# Patient Record
Sex: Female | Born: 1980 | Race: White | Hispanic: No | Marital: Married | Smoking: Never smoker
Health system: Southern US, Community
[De-identification: ages and names within clinical notes are randomized; demographics above are authoritative.]

## PROBLEM LIST (undated history)

## (undated) DIAGNOSIS — L309 Dermatitis, unspecified: Secondary | ICD-10-CM

## (undated) HISTORY — DX: Dermatitis, unspecified: L30.9

## (undated) HISTORY — PX: APPENDECTOMY (OPEN): SHX54

---

## 2016-02-14 DIAGNOSIS — O3680X0 Pregnancy with inconclusive fetal viability, not applicable or unspecified: Secondary | ICD-10-CM | POA: Insufficient documentation

## 2016-06-05 DIAGNOSIS — O034 Incomplete spontaneous abortion without complication: Secondary | ICD-10-CM | POA: Insufficient documentation

## 2016-06-05 DIAGNOSIS — O2620 Pregnancy care for patient with recurrent pregnancy loss, unspecified trimester: Secondary | ICD-10-CM | POA: Insufficient documentation

## 2019-08-14 HISTORY — PX: FOOT SURGERY: SHX648

## 2020-03-02 IMAGING — MG MA Digital Screening Mammogram
5 of 9 series · 5 of 25 positions shown · non-contrast
Comparison: none

INDICATIONS FOR EXAMINATION:                                                              
 Patient History:                                                                          
 Menarche at age 11. First Full-Term Pregnancy at age 37. Late child-bearing (after 28).   
 Patient has                                                                               
 history of breast feeding.                                                                
 Maternal grandmother had breast cancer under age 50.                                      
 Last menstrual period: 01/30/2020                                                         
 Last screening mammogram was performed 12 month(s) ago.
REASON FOR EXAM: Screening  (asymptomatic).

[L CC]
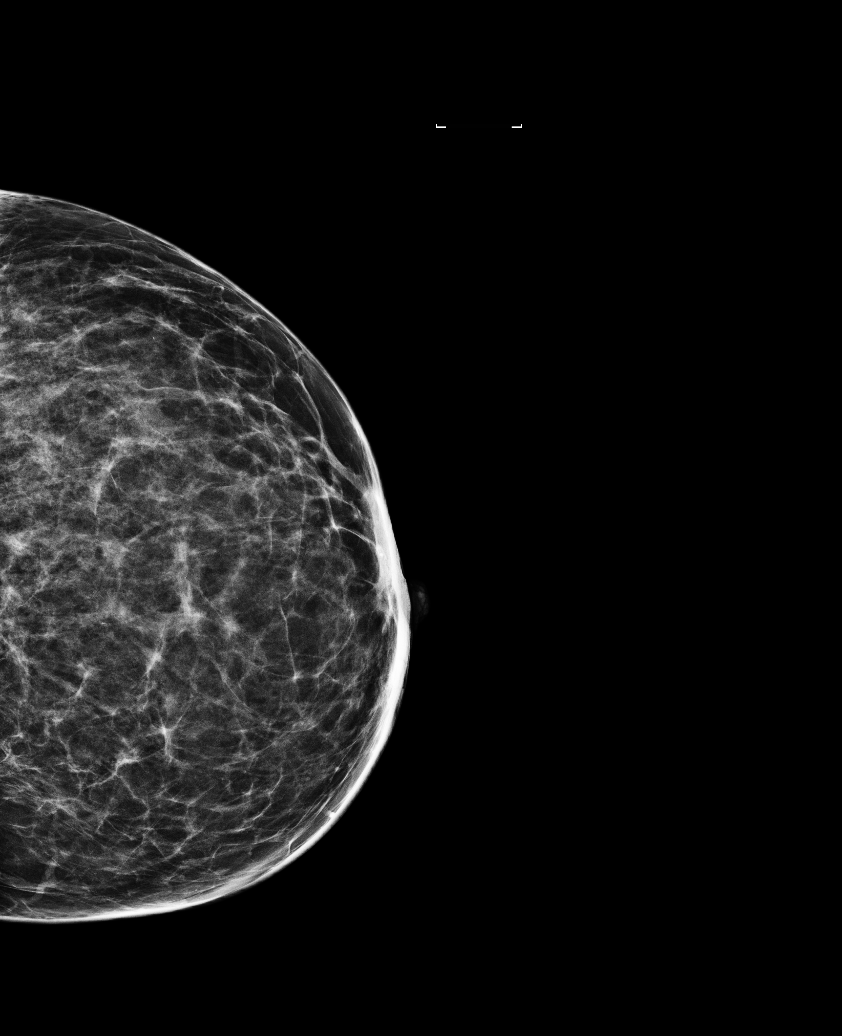

[R CC synth-2D]
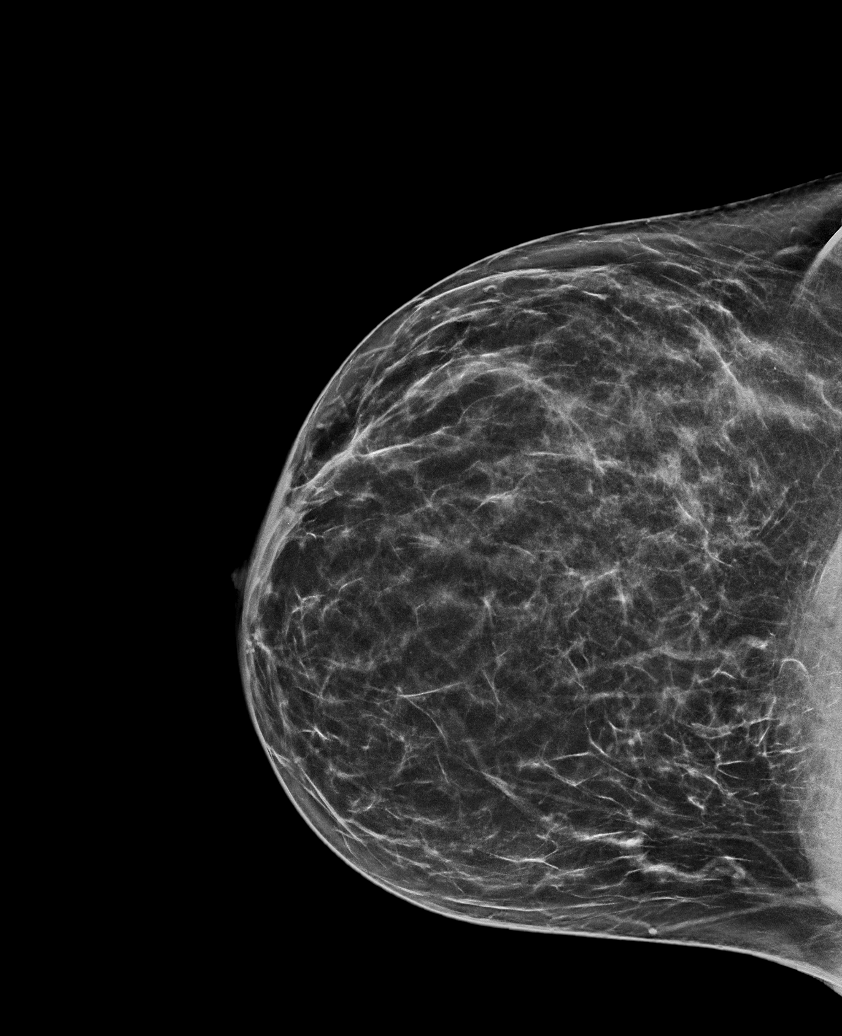

[R MLO synth-2D]
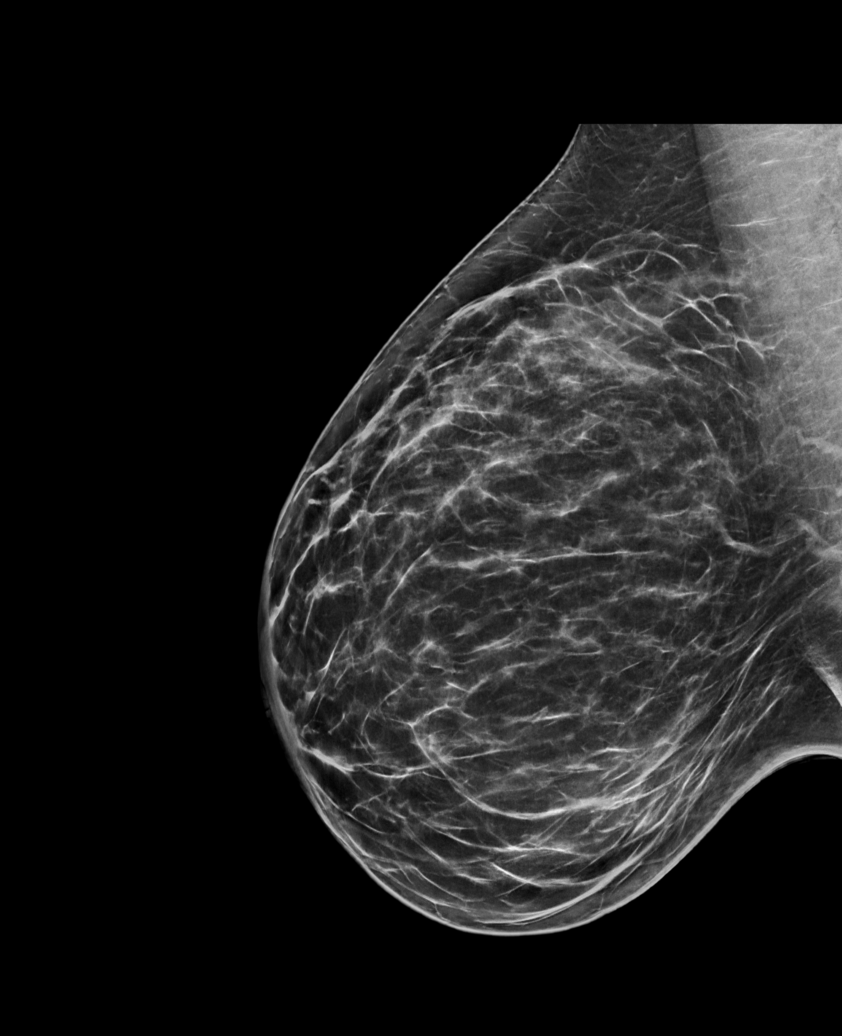

[L CC synth-2D]
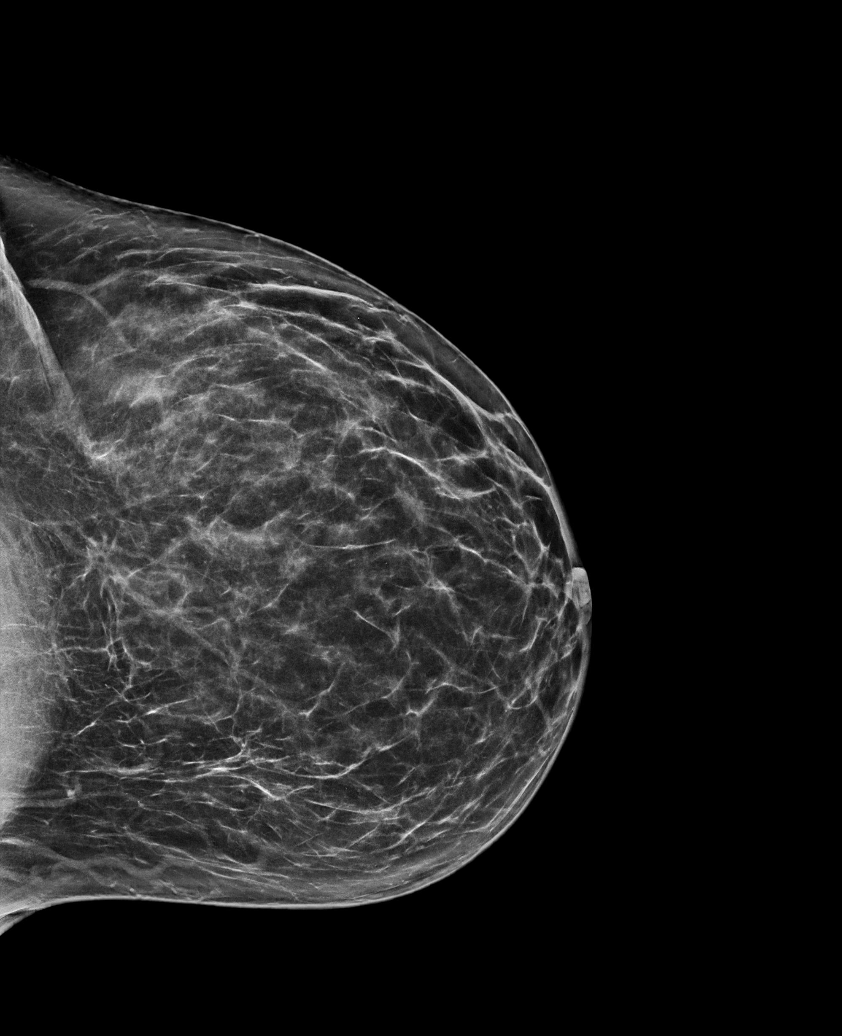

[L MLO synth-2D]
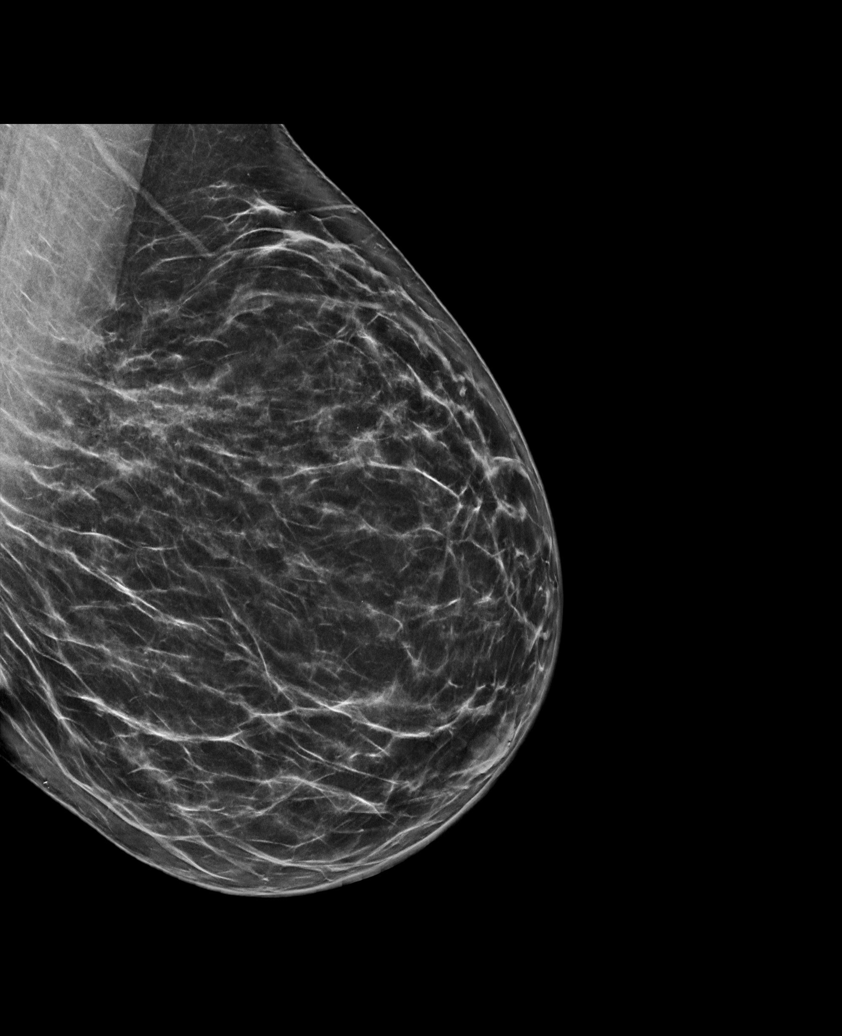

[5 of 25 positions shown; findings below may reference images not displayed]

Risk values:                                                                              
 Tyrer-Cuzick 10 year model risk: 3.2%.                                                    
 Tyrer-Cuzick Lifetime model risk: 18.6%.                                                  
 NCI Lifetime model risk: 13.5%.                                                           
 MRS Risk Manager: No High Risk calculations found at this time.                           

 Jhemboy Padam                                                                            

 DIGITAL IMAGE VIEWS:                                                                      
 Bilateral CC views were taken.                                                            
 Bilateral MLO views were taken.                                                           
 Bilateral 3D Tomo views were taken.                                                       

 PRIOR STUDY COMPARISON(S):                                                                
 02/16/2019 Bilateral Digital Diagnostic Mammo, [HOSPITAL].                                        

 TISSUE DENSITY:                                                                           
 The breasts are heterogeneously dense, which may obscure small masses.
FINDINGS: Analyzed By CAD.                                                                          
 No significant changes when compared with prior studies.                                  
 No finding suspicious or highly suggestive of malignancy.                                 

 OVERALL ACR BI-RADS ASSESSMENT: BI-RADS CATEGORY 1: NEGATIVE                              

 RECOMMENDATION:                                                                           
 Screening Mammogram of both breasts in 1 year.

## 2020-12-08 ENCOUNTER — Encounter (INDEPENDENT_AMBULATORY_CARE_PROVIDER_SITE_OTHER): Payer: Self-pay | Admitting: Family

## 2020-12-08 ENCOUNTER — Ambulatory Visit (INDEPENDENT_AMBULATORY_CARE_PROVIDER_SITE_OTHER): Admitting: Family

## 2020-12-08 VITALS — BP 110/73 | HR 52 | Temp 98.0°F | Ht 64.8 in | Wt 181.2 lb

## 2020-12-08 DIAGNOSIS — Z1231 Encounter for screening mammogram for malignant neoplasm of breast: Secondary | ICD-10-CM

## 2020-12-08 DIAGNOSIS — G8929 Other chronic pain: Secondary | ICD-10-CM

## 2020-12-08 DIAGNOSIS — M79622 Pain in left upper arm: Secondary | ICD-10-CM | POA: Insufficient documentation

## 2020-12-08 DIAGNOSIS — Z6830 Body mass index (BMI) 30.0-30.9, adult: Secondary | ICD-10-CM

## 2020-12-08 DIAGNOSIS — M545 Low back pain, unspecified: Secondary | ICD-10-CM

## 2020-12-08 DIAGNOSIS — Z0000 Encounter for general adult medical examination without abnormal findings: Secondary | ICD-10-CM

## 2020-12-08 DIAGNOSIS — Z23 Encounter for immunization: Secondary | ICD-10-CM

## 2020-12-08 HISTORY — DX: Pain in left upper arm: M79.622

## 2020-12-08 NOTE — Progress Notes (Signed)
Subjective:      Date: 12/08/2020 2:09 PM   Patient ID: Cheyenne Rangel is a 40 y.o. female.    Chief Complaint:  Chief Complaint   Patient presents with    Annual Exam     41yo female for annual  Concerns:  1) Chronic low back pain since pregnancy in 2020: pain with standing up and sitting down. Walking is OK. 2.5 years. Have appt next week.   2) left upper arm pain for 3 months, no specific injury    HPI  Visit Type: Health Maintenance Visit  Work Status: working full-time  Reported Health: fair health  Reported Diet: compliant with well-balanced diet  Reported Exercise:  walking, trying to jog twice per week  Dental: regular dental visits twice a year  Vision: glasses, contact lenses, and regular eye exams   Hearing: normal hearing  Immunization Status: immunizations up to date  Reproductive Health: sexually active  Prior Screening Tests: colon cancer screening not appropriate at this time, pap smear within past 1-3 years, and mammogram < 1 year ago  General Health Risks: no family history of colon cancer and family history of breast cancer  Safety Elements Used: uses seat belts    Problem List:  Patient Active Problem List   Diagnosis    Chronic midline low back pain without sciatica    Left upper arm pain         Current Medications:  No outpatient medications have been marked as taking for the 12/08/20 encounter (Office Visit) with Myer Haff, FNP.       Allergies:  No Known Allergies    Past Medical History:  Past Medical History:   Diagnosis Date    Eczema     childhood       Past Surgical History:  Past Surgical History:   Procedure Laterality Date    APPENDECTOMY (OPEN)      FOOT SURGERY Right 08/2019    neuroma       Family History:  Family History   Problem Relation Age of Onset    Seizures Mother     Other Mother         hypoglycemia    Heart attack Father 64    Rheum arthritis Brother     No known problems Son     Cancer Maternal Grandmother         breast    Heart attack Maternal Grandfather      Stroke Maternal Grandfather        Social History:  Social History     Tobacco Use    Smoking status: Never    Smokeless tobacco: Never   Vaping Use    Vaping Use: Never used   Substance Use Topics    Alcohol use: Yes     Comment: occasionally    Drug use: Never          The following sections were reviewed this encounter by the provider:   Tobacco   Allergies   Meds   Problems   Med Hx   Surg Hx   Fam Hx   Soc Hx          ROS:   General/Constitutional:   Well developed, well nourished. Denies fever, chills, night sweats or fatigue.  Ophthalmologic:   Visual acuity and visual fields grossly intact.  ENT:   Denies nasal congestion or drainage. Denies sinus pain. Denies sore throat.   Endocrine:   Denies change of libido. Denies  polydipsia or polyuria. Denies muscle weakness.   Breast:  Denies masses and breast discharge.   Respiratory:   Denies cough, shortness of breath or wheezing.  Cardiovascular:   Denies Chest pain at rest. Denies chest pain with exertion. Denies swelling of hands and feet.  Hematology:   Denies easy bruising and easy bleeding.  Gastrointestinal:   Denies abdominal pain. Denies constipation or diarrhea. Denies nausea and vomiting.   Gynecologic:  Denies abnormal vaginal bleeding; no history of gynecologic pathology.  Musculoskeletal:   Denies joint pain or stiffness. Denies swelling of joints. Denies leg cramps. Denies muscle aches.    Skin:   Denies rash and atypical skin lesions.  Neurologic:   Denies dizziness. Denies headache. Denies tingling in extremities. Denies weakness.  Psychiatric:   Denies anxiety. Denies depressive symptoms. Denies sleep disturbance.    Objective:     Vitals:  BP 110/73 (BP Site: Left arm, Patient Position: Sitting, Cuff Size: Large)    Pulse (!) 52    Temp 98 F (36.7 C) (Temporal)    Ht 1.646 m (5' 4.8")    Wt 82.2 kg (181 lb 3.2 oz)    LMP 11/07/2020    SpO2 98%    BMI 30.34 kg/m     Examination:   General Examination:   GENERAL APPEARANCE: alert, in no acute  distress, well developed, well nourished, oriented to time, place, and person.   HEAD: normal appearance, atraumatic.   EYES: extraocular movement intact (EOMI), pupils equal, round, reactive to light and accommodation, sclera anicteric, conjunctiva clear.   EARS: tympanic membranes normal bilaterally, external canals normal .   NOSE: normal nasal mucosa, no lesions.   ORAL CAVITY: normal oropharynx, normal lips, mucosa moist, no lesions.   THROAT: normal appearance, clear, no erythema.   NECK/THYROID: neck supple, no cervical lymphadenopathy, no neck mass palpated,  no thyromegaly.   LYMPH NODES: no palpable adenopathy.   SKIN: good turgor, no rashes, no suspicious lesions.   HEART: S1, S2 normal, no murmurs, rubs, gallops, regular rate and rhythm.   LUNGS: normal effort / no distress, normal breath sounds, clear to auscultation bilaterally, no wheezes, rales, rhonchi.   BREASTS: defer  ABDOMEN: bowel sounds present, no hepatosplenomegaly, soft, nontender, nondistended.   RECTAL: defer  FEMALE GENITOURINARY: defer  MUSCULOSKELETAL: tenderness left tricep with full ROM; tenderness lumbar region midline  EXTREMITIES: no edema, no clubbing, cyanosis, or edema.   NEUROLOGIC: nonfocal, cranial nerves 2-12 grossly intact, strength and sensation intact  PSYCH: cognitive function intact, mood/affect full range, speech clear.     Assessment/Plan:       1. Annual physical exam  - CBC and differential; Future  - Comprehensive metabolic panel; Future  - TSH, Abn Reflex to Free T4, Serum; Future  - Lipid panel; Future  - Hemoglobin A1C; Future    2. Encounter for screening mammogram for malignant neoplasm of breast  - Mammo Digital 2D Screening Bilateral W CAD; Future    3. Left upper arm pain  - Sports Medicine Referral: Schuyler Amor, MD    4. Chronic midline low back pain without sciatica    5. BMI 30.0-30.9,adult  Has consult with spine specialist scheduled.     Health Maintenance:  Recommend optimizing low carbohydrate  diet efforts and obtaining at least 150 minutes of aerobic exercise per week. Recommend 20-25 grams of dietary fiber daily. Recommend drinking at least 60-80 ounces of water per day.Immunizations UTD. Vision screening UTD. Dental Screening UTD. Mammogram screening is UTD.  Cervical cancer screening is UTD. Dermatology screening is due.  Recommend dermatology consult at earliest convenience.  Gynecology surveillance is due.  Recommend follow-up with gynecology at earliest convenience.    Reviewed s/s that would warrant further and/ or immediate medical attention. Pt in agreement with plan and all questions answered.      Return in about 1 year (around 12/08/2021) for annual exam AND as needed.    Myer Haff, FNP

## 2020-12-08 NOTE — Progress Notes (Signed)
Have you seen any specialists/other providers since your last visit with us?    No    Do you agree to telemedicine visit?  No    Arm preference verified?   Yes, no preference    There are no preventive care reminders to display for this patient.

## 2020-12-08 NOTE — Addendum Note (Signed)
Addended by: Pernell Dupre on: 12/08/2020 02:24 PM     Modules accepted: Orders

## 2020-12-12 ENCOUNTER — Other Ambulatory Visit (FREE_STANDING_LABORATORY_FACILITY)

## 2020-12-12 DIAGNOSIS — Z0000 Encounter for general adult medical examination without abnormal findings: Secondary | ICD-10-CM

## 2020-12-12 LAB — CBC AND DIFFERENTIAL
Absolute NRBC: 0 10*3/uL (ref 0.00–0.00)
Basophils Absolute Automated: 0.03 10*3/uL (ref 0.00–0.08)
Basophils Automated: 0.5 %
Eosinophils Absolute Automated: 0.16 10*3/uL (ref 0.00–0.44)
Eosinophils Automated: 2.8 %
Hematocrit: 35.6 % (ref 34.7–43.7)
Hgb: 10.9 g/dL — ABNORMAL LOW (ref 11.4–14.8)
Immature Granulocytes Absolute: 0.01 10*3/uL (ref 0.00–0.07)
Immature Granulocytes: 0.2 %
Lymphocytes Absolute Automated: 1.9 10*3/uL (ref 0.42–3.22)
Lymphocytes Automated: 32.7 %
MCH: 30.1 pg (ref 25.1–33.5)
MCHC: 30.6 g/dL — ABNORMAL LOW (ref 31.5–35.8)
MCV: 98.3 fL — ABNORMAL HIGH (ref 78.0–96.0)
MPV: 10.2 fL (ref 8.9–12.5)
Monocytes Absolute Automated: 0.27 10*3/uL (ref 0.21–0.85)
Monocytes: 4.6 %
Neutrophils Absolute: 3.44 10*3/uL (ref 1.10–6.33)
Neutrophils: 59.2 %
Nucleated RBC: 0 /100 WBC (ref 0.0–0.0)
Platelets: 336 10*3/uL (ref 142–346)
RBC: 3.62 10*6/uL — ABNORMAL LOW (ref 3.90–5.10)
RDW: 13 % (ref 11–15)
WBC: 5.81 10*3/uL (ref 3.10–9.50)

## 2020-12-12 LAB — LIPID PANEL
Cholesterol / HDL Ratio: 3.5 Index
Cholesterol: 163 mg/dL (ref 0–199)
HDL: 47 mg/dL (ref 40–9999)
LDL Calculated: 103 mg/dL — ABNORMAL HIGH (ref 0–99)
Triglycerides: 64 mg/dL (ref 34–149)
VLDL Calculated: 13 mg/dL (ref 10–40)

## 2020-12-12 LAB — COMPREHENSIVE METABOLIC PANEL
ALT: 21 U/L (ref 0–55)
AST (SGOT): 21 U/L (ref 5–34)
Albumin/Globulin Ratio: 1.6 (ref 0.9–2.2)
Albumin: 3.8 g/dL (ref 3.5–5.0)
Alkaline Phosphatase: 42 U/L (ref 37–117)
Anion Gap: 9 (ref 5.0–15.0)
BUN: 12 mg/dL (ref 7.0–19.0)
Bilirubin, Total: 0.2 mg/dL (ref 0.2–1.2)
CO2: 26 mEq/L (ref 21–29)
Calcium: 8.7 mg/dL (ref 8.5–10.5)
Chloride: 105 mEq/L (ref 100–111)
Creatinine: 0.8 mg/dL (ref 0.4–1.5)
Globulin: 2.4 g/dL (ref 2.0–3.6)
Glucose: 89 mg/dL (ref 70–100)
Potassium: 4.3 mEq/L (ref 3.5–5.1)
Protein, Total: 6.2 g/dL (ref 6.0–8.3)
Sodium: 140 mEq/L (ref 136–145)

## 2020-12-12 LAB — HEMOGLOBIN A1C
Average Estimated Glucose: 93.9 mg/dL
Hemoglobin A1C: 4.9 % (ref 4.6–5.9)

## 2020-12-12 LAB — HEMOLYSIS INDEX: Hemolysis Index: 11 Index (ref 0–24)

## 2020-12-12 LAB — THYROID STIMULATING HORMONE (TSH), REFLEX ON ABNORMAL TO FREE T4, SERUM: TSH, Abn Reflex to Free T4, Serum: 1.93 u[IU]/mL (ref 0.35–4.94)

## 2020-12-12 LAB — GFR: EGFR: >60

## 2020-12-13 ENCOUNTER — Encounter (INDEPENDENT_AMBULATORY_CARE_PROVIDER_SITE_OTHER): Payer: Self-pay | Admitting: Family

## 2020-12-14 ENCOUNTER — Encounter (INDEPENDENT_AMBULATORY_CARE_PROVIDER_SITE_OTHER): Payer: Self-pay | Admitting: Sports Medicine

## 2020-12-14 ENCOUNTER — Ambulatory Visit (INDEPENDENT_AMBULATORY_CARE_PROVIDER_SITE_OTHER): Admitting: Sports Medicine

## 2020-12-14 ENCOUNTER — Ambulatory Visit (INDEPENDENT_AMBULATORY_CARE_PROVIDER_SITE_OTHER)

## 2020-12-14 VITALS — BP 118/79 | HR 66

## 2020-12-14 DIAGNOSIS — M25512 Pain in left shoulder: Secondary | ICD-10-CM

## 2020-12-14 DIAGNOSIS — M7542 Impingement syndrome of left shoulder: Secondary | ICD-10-CM

## 2020-12-14 DIAGNOSIS — M25812 Other specified joint disorders, left shoulder: Secondary | ICD-10-CM

## 2020-12-14 DIAGNOSIS — G8929 Other chronic pain: Secondary | ICD-10-CM

## 2020-12-14 NOTE — Progress Notes (Signed)
[facility]       Provider: Zack Seal, MD  Date of Exam: 12/14/2020  Patient:  Cheyenne Rangel  DOB: Oct 20, 1980  AGE: 40 y.o.  MR#:  [id number]    CC: Left Shoulder Pain    HPI:  Cheyenne Rangel is a 40 y.o.  female who presents with c/o left shoulder and upper arm pain which started about 3 months ago without known MOI or acute event. Her pain is described as lateral and worse with flexion and abduction of her shoulder. She has pain with reaching over her head. She has not tried any treatments for this problem. She is now here for further evaluation and discussion of treatment options.    EXAM:   BP 118/79    Pulse 66   Constitutional: Pt is well-developed, well-nourished. No distress.   Head: Normocephalic, atraumatic.   Skin: No rash visualized. Pt is not diaphoretic.   Psychiatric: Affect normal. Mood normal.    Left Shoulder  Observation:  normal; no atrophy; no prominence of AC joint or rounded shoulder posture  Range of Motion (elbow at side):  Forward flexion-elevation 180 Deg;  Abduction 180 Deg  Range of Motion (shoulder abducted to 90 deg):  External Rotation 90 Deg;  Internal Rotation 90 Deg   Palpation:  Acromioclavicular Joint - nontender                     Anterior rotator cuff - nontender                     Clavicle - nontender                     Biceps Tendon - nontender                     Soft tissues - nontender  Strength:  Abduction - R 5 / 5 ; L 4+ / 5 (+mild pain)                    External Rotation - R 5 / 5 ; L 4+ / 5 (+mild pain)                    Tummy press - R 5 / 5 ; L 5 / 5                     Drop arm: Negative  Hawkin's Sign:  Positive  Neer's Sign:  Positive  Cross-arm adduction:  negative  Speed's Test:  Negative  Load and Shift:  symmetric  Sulcus Sign:  Negative  Obrien:  negative  Crank:  Negative  Ant Apprehension Test:  Negative  Relocation Test:  Negative   Neck ROM:  normal  Upper extremity distal pulses:  normal  Upper extremity  sensory:  Normal    STUDIES:   SHOULDER X-RAYS  VIEWS: X-rays: AP, zanca, and axillary views of the left shoulder were obtained.   INDICATIONS: Shoulder Pain  FINDINGS:  BONES: No fracture or dislocation present.  JOINTS: The joint spaces are normal.  SOFT TISSUE: normal.    IMPRESSION: No significant acute or degenerative osseous findings.    Data Reviewed  External Notes: I have reviewed the prior notes with each unique source listed:   Geronimo Running, FNP on 12/08/20      ASSESSMENT/PLAN:   1. Chronic left shoulder pain  XR Shoulder Left  2+ Views      2. Shoulder impingement, left          Sheri Gatchel is a very pleasant 40 y.o. female with chronic left shoulder pain. There is clinical and historical evidence of rotator cuff impingement. We had a lengthy discussion about the diagnoses as well as the various treatment options for these problems.    Plan:  Relative rest and activity modification  ice / heat as needed for comfort  Over the counter NSAIDs as needed  Rehab exercises - handout provided  Formal physical therapy offered, but declined at this time  Follow up in 6 weeks or sooner as needed if symptoms worsen  It was discussed that further imaging could be obtained or an injection could be attempted in the future as clinically indicated if there is no improvement    Teena Dunk, MD [facility]  Primary Care Sports Medicine Physician  [facility]    ----------------------------------------------------------------------------------------------------------------------------  Impression:    (Acute uncomplicated)  Amount/Complexity data reviewed: Order each test, Review each result/external note (need 2 for level 3): 2  Risk of Problem/Management: Low (99203/99213)  ------------------------------------------------------------------------------------------------------------------------------  The review of the patient's medications does not in any way constitute an endorsement, by this clinician, of  their use, dosage, indications, route, efficacy, interactions, or other clinical parameters.    This note was generated within the [facility] EMR and may contain inherent errors or omissions not intended by the user. Grammatical and punctuation errors, random word insertions, deletions, pronoun errors and incomplete sentences are occasional consequences of this technology due to software limitations. Not all errors are caught or corrected.  Although every attempt is made to root out erroneus and incomplete transcription, the note may still not fully represent the intent or opinion of the author. If there are questions or concerns about the content of this note or information contained within the body of this dictation they should be addressed directly with the author for clarification.

## 2020-12-15 ENCOUNTER — Other Ambulatory Visit: Payer: Self-pay | Admitting: Physical Medicine & Rehabilitation

## 2020-12-15 ENCOUNTER — Ambulatory Visit
Admission: RE | Admit: 2020-12-15 | Discharge: 2020-12-15 | Disposition: A | Discharge status: Home / Self Care | Source: Ambulatory Visit | Attending: Physical Medicine & Rehabilitation | Admitting: Physical Medicine & Rehabilitation

## 2020-12-15 DIAGNOSIS — M519 Unspecified thoracic, thoracolumbar and lumbosacral intervertebral disc disorder: Secondary | ICD-10-CM

## 2020-12-15 DIAGNOSIS — M5416 Radiculopathy, lumbar region: Secondary | ICD-10-CM | POA: Insufficient documentation

## 2020-12-19 ENCOUNTER — Encounter (INDEPENDENT_AMBULATORY_CARE_PROVIDER_SITE_OTHER): Payer: Self-pay | Admitting: Family

## 2020-12-19 ENCOUNTER — Other Ambulatory Visit: Payer: Self-pay | Admitting: Physical Medicine & Rehabilitation

## 2020-12-19 DIAGNOSIS — M5416 Radiculopathy, lumbar region: Secondary | ICD-10-CM

## 2020-12-28 ENCOUNTER — Other Ambulatory Visit (INDEPENDENT_AMBULATORY_CARE_PROVIDER_SITE_OTHER): Payer: Self-pay | Admitting: Family

## 2020-12-28 ENCOUNTER — Telehealth (INDEPENDENT_AMBULATORY_CARE_PROVIDER_SITE_OTHER): Payer: Self-pay | Admitting: Family

## 2020-12-28 DIAGNOSIS — D649 Anemia, unspecified: Secondary | ICD-10-CM

## 2020-12-28 NOTE — Telephone Encounter (Signed)
Pt called to make a lab appointment. Was told to come back in and get retested. No labs in the system. Please assist when available. [phone]

## 2021-01-03 ENCOUNTER — Other Ambulatory Visit (FREE_STANDING_LABORATORY_FACILITY)

## 2021-01-03 DIAGNOSIS — D649 Anemia, unspecified: Secondary | ICD-10-CM

## 2021-01-03 LAB — CBC AND DIFFERENTIAL
Absolute NRBC: 0 10*3/uL (ref 0.00–0.00)
Basophils Absolute Automated: 0.06 10*3/uL (ref 0.00–0.08)
Basophils Automated: 0.9 %
Eosinophils Absolute Automated: 0.21 10*3/uL (ref 0.00–0.44)
Eosinophils Automated: 3.2 %
Hematocrit: 37.5 % (ref 34.7–43.7)
Hgb: 12.1 g/dL (ref 11.4–14.8)
Immature Granulocytes Absolute: 0.02 10*3/uL (ref 0.00–0.07)
Immature Granulocytes: 0.3 %
Lymphocytes Absolute Automated: 1.96 10*3/uL (ref 0.42–3.22)
Lymphocytes Automated: 30.3 %
MCH: 30.8 pg (ref 25.1–33.5)
MCHC: 32.3 g/dL (ref 31.5–35.8)
MCV: 95.4 fL (ref 78.0–96.0)
MPV: 10.1 fL (ref 8.9–12.5)
Monocytes Absolute Automated: 0.29 10*3/uL (ref 0.21–0.85)
Monocytes: 4.5 %
Neutrophils Absolute: 3.93 10*3/uL (ref 1.10–6.33)
Neutrophils: 60.8 %
Nucleated RBC: 0 /100 WBC (ref 0.0–0.0)
Platelets: 399 10*3/uL — ABNORMAL HIGH (ref 142–346)
RBC: 3.93 10*6/uL (ref 3.90–5.10)
RDW: 13 % (ref 11–15)
WBC: 6.47 10*3/uL (ref 3.10–9.50)

## 2021-01-03 LAB — HEMOLYSIS INDEX: Hemolysis Index: 31 Index — ABNORMAL HIGH (ref 0–24)

## 2021-01-03 LAB — FERRITIN: Ferritin: 27.7 ng/mL (ref 4.60–204.00)

## 2021-01-03 LAB — IRON PROFILE
Iron Saturation: 21 % (ref 15–50)
Iron: 58 ug/dL (ref 40–145)
TIBC: 277 ug/dL (ref 265–497)
UIBC: 219 ug/dL (ref 126–382)

## 2021-01-03 LAB — VITAMIN B12: Vitamin B-12: 360 pg/mL (ref 211–911)

## 2021-01-09 ENCOUNTER — Ambulatory Visit

## 2021-02-05 ENCOUNTER — Ambulatory Visit

## 2021-02-06 ENCOUNTER — Other Ambulatory Visit: Payer: Self-pay | Admitting: Physical Medicine & Rehabilitation

## 2021-02-06 DIAGNOSIS — M519 Unspecified thoracic, thoracolumbar and lumbosacral intervertebral disc disorder: Secondary | ICD-10-CM

## 2021-02-06 DIAGNOSIS — M5416 Radiculopathy, lumbar region: Secondary | ICD-10-CM

## 2021-02-27 ENCOUNTER — Ambulatory Visit
Admission: RE | Admit: 2021-02-27 | Discharge: 2021-02-27 | Disposition: A | Discharge status: Home / Self Care | Source: Ambulatory Visit | Attending: Physical Medicine & Rehabilitation | Admitting: Physical Medicine & Rehabilitation

## 2021-02-27 ENCOUNTER — Other Ambulatory Visit: Payer: Self-pay | Admitting: Physical Medicine & Rehabilitation

## 2021-02-27 DIAGNOSIS — M47896 Other spondylosis, lumbar region: Secondary | ICD-10-CM | POA: Insufficient documentation

## 2021-02-27 DIAGNOSIS — M519 Unspecified thoracic, thoracolumbar and lumbosacral intervertebral disc disorder: Secondary | ICD-10-CM | POA: Insufficient documentation

## 2021-02-27 DIAGNOSIS — M48061 Spinal stenosis, lumbar region without neurogenic claudication: Secondary | ICD-10-CM | POA: Insufficient documentation

## 2021-02-27 DIAGNOSIS — M5416 Radiculopathy, lumbar region: Secondary | ICD-10-CM

## 2021-03-06 ENCOUNTER — Ambulatory Visit
Admission: RE | Admit: 2021-03-06 | Discharge: 2021-03-06 | Disposition: A | Discharge status: Home / Self Care | Source: Ambulatory Visit | Attending: Family | Admitting: Family

## 2021-03-06 DIAGNOSIS — Z1231 Encounter for screening mammogram for malignant neoplasm of breast: Secondary | ICD-10-CM | POA: Insufficient documentation

## 2021-03-09 ENCOUNTER — Encounter (INDEPENDENT_AMBULATORY_CARE_PROVIDER_SITE_OTHER): Payer: Self-pay | Admitting: Family

## 2021-04-18 ENCOUNTER — Inpatient Hospital Stay: Attending: Physical Medicine & Rehabilitation | Admitting: Rehabilitative and Restorative Service Providers"

## 2021-04-18 ENCOUNTER — Encounter: Payer: Self-pay | Admitting: Rehabilitative and Restorative Service Providers"

## 2021-04-18 VITALS — BP 130/84 | HR 67

## 2021-04-18 DIAGNOSIS — M5451 Vertebrogenic low back pain: Secondary | ICD-10-CM | POA: Insufficient documentation

## 2021-04-18 NOTE — Progress Notes (Signed)
Name:Cheyenne Rangel Age: 41 y.o.   Date of Service: 04/18/2021  Referring Physician: Raleigh Callas, MD   Date of Injury: 03/07/2021  Date Care Plan Established/Reviewed: 04/18/2021  Date Treatment Started: 04/18/2021  Date Care Plan Established/Reviewed No data was found  Date Treatment Started No data was found  (Historic) Date Care Plan Established/Reviewed No data was found  (Historic) Date Treatment Started No data was found   End of Certification Date: 07/16/2021  Sessions in Plan of Care: 16  Surgery Date: No data was found  MD Follow-up: No data was found  Medbridge Code: No data was found    Visit Count: 1   Diagnosis:   1. Vertebrogenic low back pain        Subjective     History of Present Illness   History of Present Illness: History: Pt presents to physical therapy with back pain starting about 2019 after being pregnant. Says that she thought it was related to the pregnancy, but it hasn't gone away. Says that it does come and go. Says she has tried some normal exercises and it seems to make it worse. Having pain with standing up from sitting down. States that she had a really bad flare up in September for 2-3 weeks, but then it got better. Says that she didn't do anything differently. States that sometimes it feels totally manageable and sometimes its intolerable. Current pain 6/10. Says this week has been worse and she has been driving a lot. Never has pain into leg or numbness.     Pain (location/type/mechanism): Points to Lt SI joint. Sharp pain with movement. Achy at times. 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 Had part of nerve removed in Lt foot    PLOF: See below    Red Flags: Denies numbness or tingling. Some difficulty holding bladder, but when  having a late period    Goals: "increase workout routine"    Functional Limitations (PLOF): Pain with rolling over in bed intermittently (previously no pain)  Standing from sitting is painful intermittently (previously no pain)  Pain with sitting for about an hour or more (previously no pain with sitting for prolonged periods)  Pain with picking up son (previously no pain with picking up objects)    Outcome Measure   Tool Used/Details: FOTO  Score: 60  Predicted Functional Outcome: 72    Social Support/Occupation                   Precautions: No data was found  Allergies: Patient has no known allergies.    Past Medical History:   Diagnosis Date    Eczema     childhood       Objective   Vertical/Vertical posture    ASLR painful in Lt SI; pain absolved with Pelvic stability    + FADDIR    LEFT: Rt 2/5; Lt 3/5    Active Range of Motion (degrees)     Lumbar   Extension: 35 degrees   Flexion functional reach: Mid tibia    Additional Active Range of Motion Details  Multi-segmental flexion: 7 inches from floor           BP: 130/84 Heart Rate: 67    SFMA Top  Tier Screen    Movement FN FP DP DN   Multi-segmental Flexion   x    Multi-segmental Extension x      Multi-segmental Rotation    X BIL   Single Leg Stance x      Overhead Deep Squat   x            Treatment     Therapeutic Exercises - Justified to address any of the following:  To develop strength, endurance, ROM and/or flexibility.   -Pt educated on symptoms and potential causes of symptoms with involved anatomy.   -HEP reviewed and discussed reps and importance of performing.  -Discussed POC, diagnosis, prognosis, and frequency of visits needed. Pt consents to plan.    DL bridge holds 3 reps for 20 seconds      Neuromuscular Re-Education - Justified to address any of the following:   Re-education of movement, balance, coordination, kinesthetic sense, posture and/or proprioception for sitting and/or standing activities.   Trialed pseudo SI belt with squatting and  multi-segmental flexion for re-education of movement    Abdominal bracing and breathing with cues for technique    90/90 table top abdominal bracing with cues for breathing 5 reps for 10 seconds to improve coordination/lumbar proprioception    Bird dog with legs only with cues for breathing  and bracing 3 reps for 10 seconds to improve coordination/lumbar proprioception         ---      Flowsheet Row ---   Total Time    Timed Minutes 25 minutes   Untimed Minutes 22 minutes   Total Time 47 minutes          Assessment   Cheyenne Rangel is a 41 y.o. female presenting with Low back/SI pain who requires skilled Physical Therapy services.    Clinical presentation: stable - local pain without causing other issues/symptoms  Barriers to therapy: N/A    Impairments: Pain that limits and interferes with functional ability  Decreased range of motion  Decreased strength  Decreased functional stability  Decreased joint mobility  Functional Limitations (PLOF): Pain with rolling over in bed intermittently (previously no pain)  Standing from sitting is painful intermittently (previously no pain)  Pain with sitting for about an hour or more (previously no pain with sitting for prolonged periods)  Pain with picking up son (previously no pain with picking up objects)  Prognosis: excellent  Patient is aware of diagnosis, prognosis and consents to plan of care: Yes  Plan   Visits per week: 2  Number of Sessions: 16  Direct One on One  01027: Therapeutic Exercise: To Develop Strength and Endurance, ROM and Flexibility  [id number]: Gait Training  25366: Neuromuscular Reeducation (Proprioceptive Neuromuscular Faciliation)  97140: Manual Therapy techniques (mobilization, manipulation, manual traction) (Grade I-V to lumbar spine, pelvic girdle, and regionally interdependent joints, soft tissue mobilization, instrument assisted soft tissue mobilization.)  97530: Therapeutic Activities: Dynamic activities to improve functional performance  Dry  Needling  Supervised Modalities  97010: Thermal modalities: hot/cold packs  44034: Mechnical traction  97014: Electrical stimulation  Plan for Next Session -: F/u SI Belt, F/U HEP, check hip strength, consider hip mobilizations, Check lumbar PA, Check STRs    Goals      Goal 1: Pt will be able to demo HEP with zero to min cuing with good form to allow for independent performance at home and return to PLOF    Access Code: [email or web address]  URL: [email or web address]  Date: 04/18/2021  Prepared  by: Westly Pam    Exercises  Supine 90/90 Abdominal Bracing - 1 x daily - 10 reps - 10sec hold  Supine Bridge - 1 x daily - 10 reps - 10sec hold  Bird Dog - 1 x daily - 5-10 reps - 10sec hold     Sessions: 16      Goal 2: Pt will score >/= 72/100 on FOTO FS Primary measure vs 60/100 @ IE, demonstrating improved functional mobility for return to PLOF     Sessions: 16      Goal 3: Patient will demonstrate automatic core engagement as demonstrated by supine/standing ACE test of 4/5 to transfer from sit to stand  without pain         Sessions: 16      Goal 4: Pt will demonstrate negative ASLR without support at pelvis to to improve bed mobility without pain   Sessions: 16                                  Westly Pam, DPT

## 2021-04-18 NOTE — PT/OT Therapy Note (Deleted)
Name: Cheyenne Rangel Age: 41 y.o.   Date of Service: 04/18/2021  Referring Physician:     Date of Injury: No data was found  Date Care Plan Established/Reviewed: 04/18/2021  Date Treatment Started: 04/18/2021  Date Care Plan Established/Reviewed No data was found  Date Treatment Started No data was found  (Historic) Date Care Plan Established/Reviewed No data was found  (Historic) Date Treatment Started No data was found   End of Certification Date: 07/16/2021  Sessions in Plan of Care: 16  Surgery Date: No data was found  MD Follow-up: No data was found  Medbridge Code: No data was found    Visit Count: 1   Diagnosis: No diagnosis found.    Subjective     Social Support/Occupation                   Precautions: No data was found  Allergies: Patient has no known allergies.    Objective                              Goals      Goal 1: Pt will be able to demo HEP with zero to min cuing with good form to allow for independent performance at home and return to PLOF     Sessions: 16      Goal 2: Pt will score >/= ***/100 on FOTO FS Primary measure vs ***/100 @ IE, demonstrating improved functional mobility for return to PLOF     Sessions: 16                                        Westly Pam, DPT

## 2021-04-20 ENCOUNTER — Inpatient Hospital Stay: Attending: Physical Medicine & Rehabilitation | Admitting: Rehabilitative and Restorative Service Providers"

## 2021-04-20 DIAGNOSIS — M5451 Vertebrogenic low back pain: Secondary | ICD-10-CM | POA: Insufficient documentation

## 2021-04-20 NOTE — PT/OT Therapy Note (Signed)
Name: Abbegayle Denault Age: 41 y.o.   Date of Service: 04/20/2021  Referring Physician: Raleigh Callas, MD   Date of Injury: 03/07/2021  Date Care Plan Established/Reviewed: 04/18/2021  Date Treatment Started: 04/18/2021  Date Care Plan Established/Reviewed No data was found  Date Treatment Started No data was found  (Historic) Date Care Plan Established/Reviewed No data was found  (Historic) Date Treatment Started No data was found   End of Certification Date: 07/16/2021  Sessions in Plan of Care: 16  Surgery Date: No data was found  MD Follow-up: No data was found  Medbridge Code: No data was found    Visit Count: 2   Diagnosis:   1. Vertebrogenic low back pain        Subjective     Daily Subjective   Pt reports that she has been doing the exercises daily. Said she wanted to do them twice a day, but she was too tired. States that the pain is fractionally better. Says she hasn't gotten the SI belt yet.     Social Support/Occupation                   Precautions: No data was found  Allergies: Patient has no known allergies.    Objective   Pain with DL squat  Pain with multi-segmental flexion (gower's sign) -> no gower's sign after lumbar stability interventions, however reports no decrease in pain after motion    Strength/Myotome Testing (/5)     Left Hip   Planes of Motion   Extension: 4+  Abduction: 4-    Isolated Muscles   Gluteus maximus: 4+    Right Hip   Planes of Motion   Extension: 4+  Abduction: 4+    Isolated Muscles   Gluteus maximums: 4+    Additional Strength Details  SL bridge extension test: Rt 21 seconds; Lt 38 seconds            - SI compression  no pain with SI Pas or lumbar PAs          Treatment     Therapeutic Exercises - Justified to address any of the following:  To develop strength, endurance, ROM and/or flexibility.   -Time spent for subjective intake, objective measures, and review of HEP    DL bridge with GTB around knees 3 reps for 20 seconds with cues for increasing hip  abduction    Lateral band walks with GTB around knees ~15 feet down and back 2 reps    Neuromuscular Re-Education - Justified to address any of the following:   Re-education of movement, balance, coordination, kinesthetic sense, posture and/or proprioception for sitting and/or standing activities.   Neutral spine curl ups with cues for abdominal bracing and breathing 5 reps for 10 seconds to improve lumbar proprioception and     Kneeling side planks with cues for positioning 5 reps for 10 seconds to improve lumbar proprioception, performed BIL    Manual Therapy - Justified to address any of the following:    Mobilization of joints and soft tissues, manipulation, manual lymphatic drainage, and/or manual traction.    STM throughout Lt paraspinals, QL, and surrounding myofascia    Therapeutic Activity - Justified to address the following:  Dynamic activities to improve functional performance.  Repeated sit to stands with cues for abdominal bracing 10 reps to improve sit to stand transfers     ---      Flowsheet Row ---   Total Time  Timed Minutes 40 minutes   Total Time 40 minutes          Assessment   Pt presents to physical therapy with continued low back pain. Pt responded favorably to lumbar stability interventions demonstrating no gower's sign after performing. Pt also demonstrated less pain with sit to stands and multi-segmental flexion following cues for abdominal bracing.  Pt was progressed with hip stability/strengthening interventions with no reports of discomfort. Pt can continue to benefit from physical therapy to decrease pain and return to prior level of function.    Plan   Paloff press  Front planks  F/u SI Belt  Continue hip strengthening  Resisted amb    Goals      Goal 1: Pt will be able to demo HEP with zero to min cuing with good form to allow for independent performance at home and return to [facility]    Access Code: [email or web address]  URL: [email or web address]  Date: 04/18/2021  Prepared by: Joanna Borawski    Exercises  Supine 90/90 Abdominal Bracing - 1 x daily - 10 reps - 10sec hold  Supine Bridge - 1 x daily - 10 reps - 10sec hold  Bird Dog - 1 x daily - 5-10 reps - 10sec hold     Sessions: 16      Goal 2: Pt will score >/= 72/100 on FOTO FS Primary measure vs 60/100 @ IE, demonstrating improved functional mobility for return to PLOF     Sessions: 16      Goal 3: Patient will demonstrate automatic core engagement as demonstrated by supine/standing ACE test of 4/5 to transfer from sit to stand  without pain         Sessions: 16      Goal 4: Pt will demonstrate negative ASLR without support at pelvis to to improve bed mobility without pain   Sessions: 16                                  Westly Pam, DPT

## 2021-04-25 ENCOUNTER — Inpatient Hospital Stay: Attending: Physical Medicine & Rehabilitation | Admitting: Rehabilitative and Restorative Service Providers"

## 2021-04-25 DIAGNOSIS — M5451 Vertebrogenic low back pain: Secondary | ICD-10-CM | POA: Insufficient documentation

## 2021-04-25 NOTE — PT/OT Therapy Note (Signed)
Name: Cheyenne Rangel Age: 41 y.o.   Date of Service: 04/25/2021  Referring Physician: Raleigh Callas, MD   Date of Injury: 03/07/2021  Date Care Plan Established/Reviewed: 04/18/2021  Date Treatment Started: 04/18/2021  Date Care Plan Established/Reviewed No data was found  Date Treatment Started No data was found  (Historic) Date Care Plan Established/Reviewed No data was found  (Historic) Date Treatment Started No data was found   End of Certification Date: 07/16/2021  Sessions in Plan of Care: 16  Surgery Date: No data was found  MD Follow-up: No data was found  Medbridge Code: No data was found    Visit Count: 3   Diagnosis:   1. Vertebrogenic low back pain        Subjective     Daily Subjective   Pt stated that she took a walk after last session and that felt good. She feels fine today. She still is concerned with getting back to jogging. Pt stated that her HEP is going well. She ordered the SI belt but it hasn't come in yet.     Social Support/Occupation                   Precautions: No data was found  Allergies: Patient has no known allergies.    Objective   Sacrum L rotated   (+) March test on the L  Level iliac crests   L SIJ pain with lumbar flexion AROM                  Treatment     Therapeutic Exercises - Justified to address any of the following:  To develop strength, endurance, ROM and/or flexibility.   Pt educated in anatomical structures that are relevant to their current issues.    Review of current HEP with verbal cueing to help improve form.     Time take for assessment of the SIJ's and the lumbar spine.     Pt advised to ice the area of MT when she gets home to alleviate any residual soreness post tx. She gave a verbal understanding.     Neuromuscular Re-Education - Justified to address any of the following:   Re-education of movement, balance, coordination, kinesthetic sense, posture and/or proprioception for sitting and/or standing activities.   Ab series (position #1) with  verbal/tactile cueing to improve core activation. Placed a pillow under pt's glutes to help get into the position with more ease.    Manual Therapy - Justified to address any of the following:    Mobilization of joints and soft tissues, manipulation, manual lymphatic drainage, and/or manual traction.    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 Extensive time taken to complete.     Grade III PA of the L side of the sacrum w/ active L hip IR/ER.     Grade III prone inferior sacral mobilization with active B/L knee flexion.     ---      Flowsheet Row ---   Total Time    Timed Minutes 43 minutes   Total Time 43 minutes          Assessment   (-) March test and level sacrum post tx. Pt stated that she felt a lot better with lumbar flexion post MT. She struggled with ab series initially, but was able to complete the exercise properly post cueing. Continued tx to help improve core strength and B/L LE strength will be beneficial moving forward. Pt  was told to try not using the SI belt until after next session to assess the result from today's tx.   Plan   Reassess march test  Reassess position of the sacrum  Core and B/L LE strength    Goals      Goal 1: Pt will be able to demo HEP with zero to min cuing with good form to allow for independent performance [facility]    Access Code: [email or web address]  URL: [email or web address]  Date: 04/25/2021  Prepared by: Lawernce IonKara Kashis Penley    Exercises  Supine Bridge - 1 x daily - 10 reps - 10sec hold  Bird Dog - 1 x daily - 5-10 reps - 10sec hold  Sit to Stand - 1 x daily - 3 sets - 10-15 reps  Side Plank on Knees - 1 x daily - 5-10 reps - 10sec hold  Ab Series - 1 x daily - 5 reps - 10 seconds hold     Sessions: 16      Goal 2: Pt will score >/= 72/100 on FOTO FS Primary measure vs 60/100 @ IE, demonstrating improved functional mobility for return to PLOF     Sessions: 16      Goal 3: Patient will demonstrate automatic core engagement as demonstrated by supine/standing ACE  test of 4/5 to transfer from sit to stand  without pain         Sessions: 16      Goal 4: Pt will demonstrate negative ASLR without support at pelvis to to improve bed mobility without pain   Sessions: 16                                  Sunny SchleinKara L Jamicia Haaland, DPT

## 2021-04-27 ENCOUNTER — Inpatient Hospital Stay: Attending: Physical Medicine & Rehabilitation | Admitting: Rehabilitative and Restorative Service Providers"

## 2021-04-27 DIAGNOSIS — M5451 Vertebrogenic low back pain: Secondary | ICD-10-CM | POA: Insufficient documentation

## 2021-04-27 NOTE — PT/OT Therapy Note (Signed)
Name: Cheyenne Rangel Age: 41 y.o.   Date of Service: 04/27/2021  Referring Physician: Raleigh Callas, MD   Date of Injury: 03/07/2021  Date Care Plan Established/Reviewed: 04/18/2021  Date Treatment Started: 04/18/2021  Date Care Plan Established/Reviewed No data was found  Date Treatment Started No data was found  (Historic) Date Care Plan Established/Reviewed No data was found  (Historic) Date Treatment Started No data was found   End of Certification Date: 07/16/2021  Sessions in Plan of Care: 16  Surgery Date: No data was found  MD Follow-up: No data was found  Medbridge Code: No data was found    Visit Count: 4   Diagnosis:   1. Vertebrogenic low back pain        Subjective     Daily Subjective   Pt reports that after last Friday, she went on a regular walk and she woke up feeling 100x better. Says that sharp pain is not there, but she still has the dull/achy pain. Says she was a little sore after seeing Diannia Ruder, and she iced it. Says that soreness is getting better. Pt reports that she has been having pain getting up from laying down and getting up from the toilet.    Social Support/Occupation                   Precautions: No data was found  Allergies: Patient has no known allergies.    Objective                   Treatment     Therapeutic Exercises - Justified to address any of the following:  To develop strength, endurance, ROM and/or flexibility.   -Time spent for subjective intake, objective measures, and review of HEP    DL bridge marches 2 reps for 20 seconds with cues for maintaining hip extension    Lateral step downs from 6 inch box with cues for slow tempo 15 reps, performed BIL    Neuromuscular Re-Education - Justified to address any of the following:   Re-education of movement, balance, coordination, kinesthetic sense, posture and/or proprioception for sitting and/or standing activities.   Front planks with cues for neutral spine posture to improve lumbar proprioception and postural awareness 5  reps for 10 seconds    Paloff press with green cord with cues for neutral spine and hip hinge 2 sets of 15 reps to improve lumbar proprioception and postural awareness    Manual Therapy - Justified to address any of the following:    Mobilization of joints and soft tissues, manipulation, manual lymphatic drainage, and/or manual traction.    STM throughout Rt piriformis, glute max, glute med, and surrounding myofascia    (Lumbar/sacrum PA mobilizations)    Therapeutic Activity - Justified to address the following:  Dynamic activities to improve functional performance.  Log rolling ABA practice with cues for sequence, performed several times to improve bed mobility    Sit to stands from various heights, pt educated on bracing if getting up from low chair to improve ability to get up from toilet without pain    Practice picking up 25# KB, progressed to 35# barbell picked up from ontop of bumper plates. Pt cued for weight acceptance to initiate lifting     ---      Flowsheet Row ---   Total Time    Timed Minutes 38 minutes   Total Time 38 minutes          Assessment  Pt presents to physical therapy with reports of improvements in back pain. Pt responds favorably to log rolling technique reporting less discomfort performing. Pt was progressed with lumbar stability and lower extremity strengthening with no reports of discomfort. Pt can continue to benefit from physical therapy to decrease pain and return to prior level of function.    Plan   Weight acceptance  Continue lumbar stability  Resisted amb    Goals      Goal 1: Pt will be able to demo HEP with zero to min cuing with good form to allow for independent performance at home and return to PLOF    Access Code: [email or web address]  URL: [email or web address]  Date: 04/25/2021  Prepared by: Lawernce Ion    Exercises  Supine Bridge - 1 x daily - 10 reps - 10sec hold  Bird Dog - 1 x daily - 5-10 reps - 10sec hold  Sit to Stand - 1 x daily - 3 sets - 10-15 reps  Side Plank  on Knees - 1 x daily - 5-10 reps - 10sec hold  Ab Series - 1 x daily - 5 reps - 10 seconds hold     Sessions: 16      Goal 2: Pt will score >/= 72/100 on FOTO FS Primary measure vs 60/100 @ IE, demonstrating improved functional mobility for return to PLOF     Sessions: 16      Goal 3: Patient will demonstrate automatic core engagement as demonstrated by supine/standing ACE test of 4/5 to transfer from sit to stand  without pain         Sessions: 16      Goal 4: Pt will demonstrate negative ASLR without support at pelvis to to improve bed mobility without pain   Sessions: 16                                  Westly Pam, DPT

## 2021-05-02 ENCOUNTER — Inpatient Hospital Stay: Attending: Physical Medicine & Rehabilitation | Admitting: Rehabilitative and Restorative Service Providers"

## 2021-05-02 DIAGNOSIS — M5451 Vertebrogenic low back pain: Secondary | ICD-10-CM | POA: Insufficient documentation

## 2021-05-02 NOTE — PT/OT Therapy Note (Signed)
Name: Shaylah Mcghie Age: 41 y.o.   Date of Service: 05/02/2021  Referring Physician: Raleigh Callas, MD   Date of Injury: 03/07/2021  Date Care Plan Established/Reviewed: 04/18/2021  Date Treatment Started: 04/18/2021  Date Care Plan Established/Reviewed No data was found  Date Treatment Started No data was found  (Historic) Date Care Plan Established/Reviewed No data was found  (Historic) Date Treatment Started No data was found   End of Certification Date: 07/16/2021  Sessions in Plan of Care: 16  Surgery Date: No data was found  MD Follow-up: No data was found  Medbridge Code: No data was found    Visit Count: 5   Diagnosis:   1. Vertebrogenic low back pain        Subjective     Daily Subjective   Pt stated that she continues to feel better overall. She has been getting up like Tommy taught her and that's been helping. HEP is still going well. She still has pain that goes straight across the low back first thing in the morning. She thinks it might be her mattress.     Social Support/Occupation                   Precautions: No data was found  Allergies: Patient has no known allergies.    Objective   (-) March test   No pain with all lumbar AROM                  Treatment     Therapeutic Exercises - Justified to address any of the following:  To develop strength, endurance, ROM and/or flexibility.   Pt educated on anatomical structures that are relevant to her current issues.    Review of current HEP with verbal cueing to help improve form.     Upright back x3 min (seat 2, level 6) for B/L LE strengthening.     Time take for assessment of the SIJ's and the lumbar spine.     Neuromuscular Re-Education - Justified to address any of the following:   Re-education of movement, balance, coordination, kinesthetic sense, posture and/or proprioception for sitting and/or standing activities.   Side stepping  w/ GTB x2 down and back the length of the clinic with moderate verbal cueing to help improve B/L glute med  activation. Extensive time taken to complete.     Banded (GTB) LP 100# x3 min with verbal/tactile cueing to help improve quad and glute activation during the exercise.    Therapeutic Activity - Justified to address the following:  Dynamic activities to improve functional performance.  Pt educated on proper side lying sleeping posture with the use of the A-B-A technique. Pt advised to place a towel roll under her side to help decrease pressure at the low back when sleeping.     Resisted walking w/ green power band x4 down and back the length of the clinic with verbal cueing for form.      ---      Flowsheet Row ---   Total Time    Timed Minutes 41 minutes   Total Time 41 minutes          Assessment   Pt was able to complete all exercises without pain in the low back this session, but she was fatigued in the glutes this session. Pt stated at the felt good in new sleeping position post education, so follow up with that will be needed next session. Pt would benefit from  continued tx to help improve B/L glute and core strength further.   Plan   Core and B/L glute strength  Follow up with sleeping    Goals      Goal 1: Pt will be able to demo HEP with zero to min cuing with good form to allow for independent performance at home and return to PLOF    Access Code: [email or web address]  URL: [email or web address]  Date: 04/25/2021  Prepared by: Lawernce IonKara Muskan Bolla    Exercises  Supine Bridge - 1 x daily - 10 reps - 10sec hold  Bird Dog - 1 x daily - 5-10 reps - 10sec hold  Sit to Stand - 1 x daily - 3 sets - 10-15 reps  Side Plank on Knees - 1 x daily - 5-10 reps - 10sec hold  Ab Series - 1 x daily - 5 reps - 10 seconds hold     Sessions: 16      Goal 2: Pt will score >/= 72/100 on FOTO FS Primary measure vs 60/100 @ IE, demonstrating improved functional mobility for return to PLOF     Sessions: 16      Goal 3: Patient will demonstrate automatic core engagement as demonstrated by supine/standing ACE test of 4/5 to transfer from sit  to stand  without pain         Sessions: 16      Goal 4: Pt will demonstrate negative ASLR without support at pelvis to to improve bed mobility without pain   Sessions: 16                                  Sunny SchleinKara L Gladyes Kudo, DPT

## 2021-05-04 ENCOUNTER — Inpatient Hospital Stay: Admitting: Rehabilitative and Restorative Service Providers"

## 2021-05-09 ENCOUNTER — Inpatient Hospital Stay: Attending: Physical Medicine & Rehabilitation | Admitting: Rehabilitative and Restorative Service Providers"

## 2021-05-09 DIAGNOSIS — M5451 Vertebrogenic low back pain: Secondary | ICD-10-CM | POA: Insufficient documentation

## 2021-05-09 NOTE — Progress Notes (Signed)
Name:Cheyenne Rangel Age: 41 y.o.   Date of Service: 05/09/2021  Referring Physician: Raleigh Callas, MD   Date of Injury: 03/07/2021  Date Care Plan Established/Reviewed: 04/18/2021  Date Treatment Started: 04/18/2021  Date Care Plan Established/Reviewed No data was found  Date Treatment Started No data was found  (Historic) Date Care Plan Established/Reviewed No data was found  (Historic) Date Treatment Started No data was found   End of Certification Date: 07/16/2021  Sessions in Plan of Care: 16  Surgery Date: No data was found  MD Follow-up: No data was found  Medbridge Code: No data was found    Visit Count: 6   Diagnosis:   1. Vertebrogenic low back pain        Subjective     Daily Subjective   Pt stated that she hasn't had low back pain recently and that she has been sleeping better. She felt fine after last session. She has been doing her HEP and it's going well.     Social Support/Occupation                   Precautions: No data was found  Allergies: Patient has no known allergies.    Past Medical History:   Diagnosis Date    Eczema     childhood       Objective                   Lower Body Strength (/5) 04/18/2021 04/20/2021 05/09/21   Left hip extension  4+ 5   Left hip abduction  4- 4+   Right hip extension  4+ 5   Right hip abduction  4+ 4+   Additonal strength details - hip (formlet 563)  SL bridge extension test: Rt 21 seconds; Lt 38 seconds    Left gluteus maximus  4+    Right gluteus maximus  4+    Lumbar AROM (degrees) 04/18/2021     Lumbar flexion functional reach Mid tibia  Floor   Lumbar extension 35     Additional active range of motion details Multi-segmental flexion: 7 inches from floor  Able to touch the floor without pain.                                                                                                                                                           Treatment     Therapeutic Exercises - Justified to address any of the following:  To develop strength, endurance, ROM  and/or flexibility.   Pt educated on anatomical structures that are relevant to her current issues.    Review of current HEP with verbal cueing to help improve form with all exercises. Pt advised on post discharge HEP completion.     Time taken for updated objective  measurements billed with TE.    FOTO survey was given to the pt this session. The results of the survey were reviewed and pt was made aware of their importance.    Upright bike x4 min to warm up.      ---      Flowsheet Row ---   Total Time    Timed Minutes 40 minutes   Total Time 40 minutes          Assessment   Patient has been seen for 6 visits in this case from No data was found until 05/09/2021. The encounter diagnosis was Vertebrogenic low back pain. Patient has made excellent  progress in pain, range of motion, strength, functional stability, joint mobility, soft tissue mobility, and neural tension since the initial evaluation. Patient is ready for discharge from skilled therapy intervention at this time. Pt to be d/c'd to HEP at this time. Pt ed provided re when to seek out skilled PT again if symptoms return and if unable to manage with current HEP.  Goals      Goal 1: Pt will be able to demo HEP with zero to min cuing with good form to allow for independent performance at home and return to PLOF    Access Code: [email or web address]  URL: [email or web address]  Date: 04/25/2021  Prepared by: Lawernce Ion    Exercises  Supine Bridge - 1 x daily - 10 reps - 10sec hold  Bird Dog - 1 x daily - 5-10 reps - 10sec hold  Sit to Stand - 1 x daily - 3 sets - 10-15 reps  Side Plank on Knees - 1 x daily - 5-10 reps - 10sec hold  Ab Series - 1 x daily - 5 reps - 10 seconds hold    MET  KD 05/10/21   Sessions: 16      Goal 2: Pt will score >/= 72/100 on FOTO FS Primary measure vs 60/100 @ IE, demonstrating improved functional mobility for return to PLOF    FOTO = 83  KD 05/10/21   Sessions: 16      Goal 3: Patient will demonstrate automatic core engagement as  demonstrated by supine/standing ACE test of 4/5 to transfer from sit to stand  without pain    Pt able to complete sit to stands without pain in the low back. KD 05/10/21    MET   Sessions: 16      Goal 4: Pt will demonstrate negative ASLR without support at pelvis to to improve bed mobility without pain    MET  KD 05/10/21   Sessions: 16                                  Sunny Schlein, DPT

## 2021-05-11 ENCOUNTER — Inpatient Hospital Stay: Admitting: Rehabilitative and Restorative Service Providers"

## 2021-06-14 ENCOUNTER — Ambulatory Visit (INDEPENDENT_AMBULATORY_CARE_PROVIDER_SITE_OTHER): Admitting: Family

## 2021-06-14 ENCOUNTER — Encounter (INDEPENDENT_AMBULATORY_CARE_PROVIDER_SITE_OTHER): Payer: Self-pay

## 2021-06-14 VITALS — BP 124/80 | HR 93 | Temp 98.8°F | Resp 18 | Ht 64.0 in | Wt 185.4 lb

## 2021-06-14 DIAGNOSIS — J029 Acute pharyngitis, unspecified: Secondary | ICD-10-CM

## 2021-06-14 LAB — IN OFFICE COVID POCT ABBOTT ID NOW: SARS CoV 2 Overall Result: NEGATIVE

## 2021-06-14 LAB — POCT INFLUENZA A/B
POCT Rapid Influenza A AG: NEGATIVE
POCT Rapid Influenza B AG: NEGATIVE

## 2021-06-14 LAB — POCT RAPID STREP A: Rapid Strep A Screen POCT: NEGATIVE

## 2021-06-14 MED ORDER — NYSTATIN 100000 UNIT/ML MT SUSP
10.0000 mL | Freq: Four times a day (QID) | ORAL | 0 refills | Status: DC
Start: 2021-06-14 — End: 2021-09-04

## 2021-06-14 NOTE — Progress Notes (Signed)
Urgent Care Provider Note    Patient: Cheyenne Rangel   Date: 06/14/2021   MRN: [id number]       Subjective     Chief Complaint   Patient presents with    Sore Throat     27 y f c/o nausea and sore throat 3 days        HPI:  HPI    Cheyenne Rangel is a 42 y.o. female with c/o upper respiratory symptoms that are moderate which started 2 to 3 days and has been stable. Patient admits to sore throat, nausea, mild ear pain and pressure, and body aches that have since improved. Patient denies cough, SOB, diarrhea, vomiting, congestion. Taking Dayquil and Nyquil with limited relief.    Pertinent Past Medical, Surgical, Family and Social History were reviewed.      Current Outpatient Medications:     diphenhydrAMINE-lidocaine viscous-nystatin-alum & mag hydroxide-simethicone (MAGIC MOUTHWASH) suspension, Take 10 mLs by mouth 4 (four) times daily Gargle and spit as needed for throat pain, Disp: 120 mL, Rfl: 0    No Known Allergies    Medications and Allergies reviewed.         Objective     Vitals:    06/14/21 1122   BP: 124/80   Pulse: 93   Resp: 18   Temp: 98.8 F (37.1 C)     Body mass index is 31.82 kg/m.    Physical Exam    General: well developed, well nourished, no acute distress.     Eyes: No conjunctival injection or discharge.    Ear: Bilateral TM without erythema, bulging or perforation; normal auditory canals and pinnae; no mastoid tenderness.    Nose/sinus: no nasal congestion; no edema or erythema overlying the sinuses, no sinus tenderness.    Throat: posterior oropharynx erythema without exudates; no peritonsillar bulging, normal, midline uvula; no trismus or drooling.    Neck: No stridor. Normal ROM. Anterior cervical lymphadenopathy.     Lung: normal work of breathing, speaking complete sentences, no rales, wheezing or rhonchi.    Heart: Regular rhythm, no murmurs.    UCC COURSE  LABS  The following POCT tests were ordered, reviewed and discussed with the patient/family.     Results       Procedure  Component Value Units Date/Time    Influenza A/B [[id number]]  (Normal) Collected: 06/14/21 1149     Updated: 06/14/21 1149     POCT QC Pass     POCT Rapid Influenza A AG Negative     POCT Rapid Influenza B AG Negative    Rapid Group A Strep [[id number]]  (Normal) Collected: 06/14/21 1149    Specimen: Throat Updated: 06/14/21 1149     POCT QC Pass     Rapid Strep A Screen POCT Negative     Comment Negative Results should be confirmed by throat Cx to confirm absence of Strep A inf.    Abbott ID Now SARS-COV-2 POCT [[id number]]  (Normal) Collected: 06/14/21 1149    Specimen: Nasal Swab COVID-19 Updated: 06/14/21 1149     SARS CoV 2 Overall Result Negative            There were no x-rays reviewed with this patient during the visit.    No current facility-administered medications for this visit.             PROCEDURES:  Procedures    MDM:  Sinusitis, Bronchitis, Pharyngitis, Pneumonia, Influenza, COVID-19 and Allergic Rhinitis  Assessment     Cheyenne Rangel was seen today for sore throat.    Diagnoses and all orders for this visit:    Acute pharyngitis, unspecified etiology  -     Throat Culture    Sore throat  -     Abbott ID Now SARS-COV-2 POCT  -     Influenza A/B  -     Rapid Group A Strep    Other orders  -     diphenhydrAMINE-lidocaine viscous-nystatin-alum & mag hydroxide-simethicone (MAGIC MOUTHWASH) suspension; Take 10 mLs by mouth 4 (four) times daily Gargle and spit as needed for throat pain    Findings consistent with viral pharyngitis  OTC analgesics discussed (NSAIDS, acetaminophen)  Salt water gargles  Maintain adequate fluid hydration  Seek further medical evaluation if any concerning/worsening symptoms  Patient verbalized understanding and had no further questions/concerns     Plan and follow-up discussed with patient. See AVS for further documentation.

## 2021-06-18 ENCOUNTER — Other Ambulatory Visit (INDEPENDENT_AMBULATORY_CARE_PROVIDER_SITE_OTHER): Payer: Self-pay | Admitting: Registered Nurse

## 2021-06-18 MED ORDER — AMOXICILLIN 500 MG PO CAPS
500.0000 mg | ORAL_CAPSULE | Freq: Two times a day (BID) | ORAL | 0 refills | Status: AC
Start: 2021-06-18 — End: 2021-06-28

## 2021-09-04 ENCOUNTER — Encounter (INDEPENDENT_AMBULATORY_CARE_PROVIDER_SITE_OTHER): Payer: Self-pay | Admitting: Emergency Medicine

## 2021-09-04 ENCOUNTER — Ambulatory Visit (INDEPENDENT_AMBULATORY_CARE_PROVIDER_SITE_OTHER): Admitting: Emergency Medicine

## 2021-09-04 VITALS — BP 120/86 | HR 67 | Temp 98.5°F | Ht 64.0 in | Wt 184.4 lb

## 2021-09-04 DIAGNOSIS — E6609 Other obesity due to excess calories: Secondary | ICD-10-CM

## 2021-09-04 DIAGNOSIS — Z6831 Body mass index (BMI) 31.0-31.9, adult: Secondary | ICD-10-CM | POA: Insufficient documentation

## 2021-09-04 MED ORDER — NALTREXONE-BUPROPION HCL ER 8-90 MG PO TB12
ORAL_TABLET | ORAL | 0 refills | Status: AC
Start: 2021-09-04 — End: 2021-10-04

## 2021-09-04 NOTE — Progress Notes (Signed)
Have you seen any specialists/other providers since your last visit with [place]?    No    The patient was informed that the following HM items are still outstanding:     Health Maintenance Due   Topic Date Due    Cervical Cancer Screening Three Years  Never done    HEPATITIS C SCREENING  Never done    COVID-19 Vaccine (4 - Booster for Pfizer series) 06/08/2020

## 2021-09-04 NOTE — Progress Notes (Signed)
Subjective:      Date: 09/04/2021 12:21 PM   Patient ID: Cheyenne Rangel is a 41 y.o. female.    Chief Complaint:  Chief Complaint   Patient presents with    Weight Loss     Discuss options         HPI:  HPI     Weight loss:  -09/04/21--patient states she has been trying to lose weight for the past 3 years and has been unsuccessful.  Her current weight is 184 pounds.  Her goal weight is 165 pounds and the last time she weighed that was about 5 years ago prior to her last pregnancy.  Patient is currently doing the online Noom app.  She has logs in calories and food intake daily.  Her goal is about 1200 to 1400 cal/day.  She is also trying to complete 8000 steps per day.  She recently completed physical therapy for back pain and she is trying to get back into running.  She is not interested in bariatric surgery.      Problem List:  Patient Active Problem List   Diagnosis    Chronic midline low back pain without sciatica    Left upper arm pain    Vertebrogenic low back pain    BMI 31.0-31.9,adult    Class 1 obesity due to excess calories without serious comorbidity with body mass index (BMI) of 31.0 to 31.9 in adult       Current Medications:  No outpatient medications have been marked as taking for the 09/04/21 encounter (Office Visit) with Forde Radon, MD.       Allergies:  No Known Allergies    Past Medical History:  Past Medical History:   Diagnosis Date    Eczema     childhood       Past Surgical History:  Past Surgical History:   Procedure Laterality Date    APPENDECTOMY (OPEN)      FOOT SURGERY Right 08/2019    neuroma       Family History:  Family History   Problem Relation Age of Onset    Seizures Mother     Other Mother         hypoglycemia    Heart attack Father 28    Breast cancer Maternal Grandmother     Cancer Maternal Grandmother         breast    Heart attack Maternal Grandfather     Stroke Maternal Grandfather     Rheum arthritis Brother     No known problems Son        Social History:  Social  History     Socioeconomic History    Marital status: Married   Tobacco Use    Smoking status: Never    Smokeless tobacco: Never   Vaping Use    Vaping status: Never Used   Substance and Sexual Activity    Alcohol use: Yes     Comment: occasionally    Drug use: Never    Sexual activity: Yes     Social Determinants of Health     Financial Resource Strain: Low Risk  (09/03/2021)    Overall Financial Resource Strain (CARDIA)     Difficulty of Paying Living Expenses: Not hard at all   Food Insecurity: No Food Insecurity (09/03/2021)    Hunger Vital Sign     Worried About Running Out of Food in the Last Year: Never true     Ran Out of  Food in the Last Year: Never true   Transportation Needs: No Transportation Needs (09/03/2021)    PRAPARE - Therapist, art (Medical): No     Lack of Transportation (Non-Medical): No   Physical Activity: Insufficiently Active (09/03/2021)    Exercise Vital Sign     Days of Exercise per Week: 4 days     Minutes of Exercise per Session: 30 min   Stress: No Stress Concern Present (09/03/2021)    [employer] of Occupational Health - Occupational Stress Questionnaire     Feeling of Stress : Not at all   Social Connections: Moderately Isolated (09/03/2021)    Social Connection and Isolation Panel [NHANES]     Frequency of Communication with Friends and Family: Once a week     Frequency of Social Gatherings with Friends and Family: Once a week     Attends Religious Services: Never     Database administrator or Organizations: Yes     Attends Banker Meetings: 1 to 4 times per year     Marital Status: Married   Catering manager Violence: Not At Risk (09/03/2021)    Humiliation, Afraid, Rape, and Kick questionnaire     Fear of Current or Ex-Partner: No     Emotionally Abused: No     Physically Abused: No     Sexually Abused: No   Housing Stability: Low Risk  (09/03/2021)    Housing Stability Vital Sign     Unable to Pay for Housing in the Last Year: No     Number  of Places Lived in the Last Year: 1     Unstable Housing in the Last Year: No        The following sections were reviewed this encounter by the provider:   Tobacco  Allergies  Meds  Problems  Med Hx  Surg Hx  Fam Hx           ROS:  Review of Systems   Constitutional: Negative.  Negative for chills, fatigue and fever.   HENT: Negative.  Negative for congestion and sore throat.    Eyes: Negative.  Negative for pain and visual disturbance.   Respiratory: Negative.  Negative for cough and shortness of breath.    Cardiovascular: Negative.  Negative for chest pain and palpitations.   Gastrointestinal: Negative.  Negative for abdominal pain, nausea and vomiting.   Endocrine: Negative.  Negative for cold intolerance and heat intolerance.   Genitourinary: Negative.  Negative for difficulty urinating and dysuria.   Musculoskeletal: Negative.  Negative for arthralgias and back pain.   Skin: Negative.  Negative for color change and rash.   Neurological: Negative.  Negative for dizziness and headaches.   Psychiatric/Behavioral: Negative.  Negative for agitation and behavioral problems.    All other systems reviewed and are negative.     Objective:   Vitals:  BP 120/86   Pulse 67   Temp 98.5 F (36.9 C)   Ht 1.626 m (5\' 4" )   Wt 83.6 kg (184 lb 6.4 oz)   LMP 08/09/2021 (Exact Date)   SpO2 100%   BMI 31.65 kg/m       Physical Exam:  Physical Exam  Vitals and nursing note reviewed.   Constitutional:       General: She is not in acute distress.     Appearance: Normal appearance. She is not toxic-appearing.   HENT:      Head: Normocephalic and atraumatic.  Eyes:      Extraocular Movements: Extraocular movements intact.      Conjunctiva/sclera: Conjunctivae normal.      Pupils: Pupils are equal, round, and reactive to light.   Pulmonary:      Effort: Pulmonary effort is normal.   Musculoskeletal:         General: Normal range of motion.      Cervical back: Normal range of motion.   Skin:     General: Skin is warm.    Neurological:      General: No focal deficit present.      Mental Status: She is alert and oriented to person, place, and time.   Psychiatric:         Mood and Affect: Mood normal.         Behavior: Behavior normal.         Thought Content: Thought content normal.         Judgment: Judgment normal.        Assessment:       1. BMI 31.0-31.9,adult  - Ambulatory referral to Nutrition Services; Future  - naltrexone-buPROPion HCl ER (CONTRAVE 8-90) 8-90 MG Tablet SR 12 hr per tablet; Take 1 tablet by mouth in the morning for 1 week. Week 2, take 1 tablet by mouth in the morning and 1 tablet in the evening for 1 week. Week 3, take 2 tablets by mouth in the morning and 1 tablet in the evening for 1 week.  Week 4, take 2 tablets in the morning and 2 tablets in the evening.  Dispense: 70 tablet; Refill: 0    2. Class 1 obesity due to excess calories without serious comorbidity with body mass index (BMI) of 31.0 to 31.9 in adult  - Ambulatory referral to Nutrition Services; Future  - naltrexone-buPROPion HCl ER (CONTRAVE 8-90) 8-90 MG Tablet SR 12 hr per tablet; Take 1 tablet by mouth in the morning for 1 week. Week 2, take 1 tablet by mouth in the morning and 1 tablet in the evening for 1 week. Week 3, take 2 tablets by mouth in the morning and 1 tablet in the evening for 1 week.  Week 4, take 2 tablets in the morning and 2 tablets in the evening.  Dispense: 70 tablet; Refill: 0        Plan:     Problem   Bmi 31.0-31.9,Adult   Class 1 Obesity Due to Excess Calories Without Serious Comorbidity With Body Mass Index (Bmi) of 31.0 to 31.9 in Adult    09/04/21: Current BMI 31 with weight 184 pounds.  Goal weight is about 20 pounds less than 165 pounds.  Spent a long time today counseling the patient on healthy habits including consumption of a well-balanced diet and exercise to help promote weight loss.  Referral given to nutritionist.  Discussed meds such as phentermine versus Contrave and patient interested in trying Contrave.   Follow-up in 3 months.            Return in about 3 months (around 12/05/2021) for Weight management.    Risks and benefits of medical care (including but not limited to vaccinations, medications, blood tests, imaging, offsite referrals, etc.) discussed in detail with the patient. All questions and concerns were satisfactorily addressed as per the patient. The patient acknowledges and understands the treatment plan discussed during today's visit and if the patient has any adverse outcome or worsening condition, the patient will follow up immediately in the clinic or at  an ER/Urgent care facility.     This note was dictated using voice recognition speech to text software in may contain inadvertent recognition errors.    Forde Radon, MD

## 2021-09-05 ENCOUNTER — Encounter (INDEPENDENT_AMBULATORY_CARE_PROVIDER_SITE_OTHER): Payer: Self-pay | Admitting: Emergency Medicine

## 2021-10-09 ENCOUNTER — Ambulatory Visit (INDEPENDENT_AMBULATORY_CARE_PROVIDER_SITE_OTHER): Admitting: Specialist

## 2021-10-09 ENCOUNTER — Encounter (INDEPENDENT_AMBULATORY_CARE_PROVIDER_SITE_OTHER): Payer: Self-pay | Admitting: Specialist

## 2021-10-09 VITALS — BP 114/80 | HR 73 | Temp 97.6°F | Wt 189.6 lb

## 2021-10-09 DIAGNOSIS — Z319 Encounter for procreative management, unspecified: Secondary | ICD-10-CM

## 2021-10-09 DIAGNOSIS — N96 Recurrent pregnancy loss: Secondary | ICD-10-CM

## 2021-10-09 MED ORDER — CLOMIPHENE CITRATE 50 MG PO TABS
100.0000 mg | ORAL_TABLET | Freq: Every day | ORAL | 0 refills | Status: AC
Start: 2021-10-09 — End: 2021-10-24

## 2021-10-09 NOTE — Progress Notes (Signed)
Chief Complaint   Patient presents with    Consult (Initial)     41 y/o [id number] present in office today to discuss fertility concerns. She has been trying to conceive for the past 2 years. She has not had any testing done.       Subjective:      Cheyenne Rangel a 41 y.o. presents for infertility consult.    Tried clomid for 3 months and did not conceive 2021    Needed 2 rounds clomid and injectables to conceive her son who is 3 now    History of 6 miscarriages  All first trimester except one which was 16 weeks  All spontaneously passed   No D+C    Same female partner throughout    Had some testing in [place] in 2017    Has been trying to conceive for 2 years    Has been using opk's with peaks most months    Irreg periods since covid vaccines meaning the timing is off and the pain is worse      Not sure if ever had pelvic imaging    Husband semen analysis normal 2017 and he is healthy                                                                Patient's last menstrual period was Patient's last menstrual period was 09/19/2021 (exact date).          Past Medical History:   Diagnosis Date    Eczema     childhood       Past Surgical History:   Procedure Laterality Date    APPENDECTOMY (OPEN)      FOOT SURGERY Right 08/2019    neuroma       Social History     Socioeconomic History    Marital status: Married   Tobacco Use    Smoking status: Never     Passive exposure: Never    Smokeless tobacco: Never   Vaping Use    Vaping Use: Never used   Substance and Sexual Activity    Alcohol use: Yes     Comment: occasionally    Drug use: Never    Sexual activity: Yes     Partners: Male     Birth control/protection: None     Social Determinants of Health     Financial Resource Strain: Low Risk  (09/03/2021)    Overall Financial Resource Strain (CARDIA)     Difficulty of Paying Living Expenses: Not hard at all   Food Insecurity: No Food Insecurity (09/03/2021)    Hunger Vital Sign     Worried About Running Out of Food in the Last  Year: Never true     Ran Out of Food in the Last Year: Never true   Transportation Needs: No Transportation Needs (09/03/2021)    PRAPARE - Therapist, art (Medical): No     Lack of Transportation (Non-Medical): No   Physical Activity: Insufficiently Active (09/03/2021)    Exercise Vital Sign     Days of Exercise per Week: 4 days     Minutes of Exercise per Session: 30 min   Stress: No Stress Concern Present (09/03/2021)    [employer] of Occupational  Health - Occupational Stress Questionnaire     Feeling of Stress : Not at all   Social Connections: Moderately Isolated (09/03/2021)    Social Connection and Isolation Panel [NHANES]     Frequency of Communication with Friends and Family: Once a week     Frequency of Social Gatherings with Friends and Family: Once a week     Attends Religious Services: Never     Database administrator or Organizations: Yes     Attends Banker Meetings: 1 to 4 times per year     Marital Status: Married   Catering manager Violence: Not At Risk (09/03/2021)    Humiliation, Afraid, Rape, and Kick questionnaire     Fear of Current or Ex-Partner: No     Emotionally Abused: No     Physically Abused: No     Sexually Abused: No   Housing Stability: Low Risk  (09/03/2021)    Housing Stability Vital Sign     Unable to Pay for Housing in the Last Year: No     Number of Places Lived in the Last Year: 1     Unstable Housing in the Last Year: No       Family History   Problem Relation Age of Onset    Seizures Mother     Other Mother         hypoglycemia    Heart attack Father 47    Breast cancer Maternal Grandmother     Cancer Maternal Grandmother         breast    Heart attack Maternal Grandfather     Stroke Maternal Grandfather     Rheum arthritis Brother     No known problems Son        OB History   Gravida Para Term Preterm AB Living   7 1 1   6 1    SAB IAB Ectopic Multiple Live Births   6 0     1      # Outcome Date GA Lbr Len/2nd Weight Sex Delivery Anes  PTL Lv   7 SAB            6 SAB            5 SAB            4 SAB            3 SAB            2 SAB            1 Term      Vag-Spont   LIV       No Known Allergies      Prior to Admission medications    Not on File               Review of Systems   No Known Allergies    All other systems were reviewed and are negative except as previously noted in the hpi.     Objective:       Vitals:    10/09/21 1437   BP: 114/80   Pulse: 73   Temp: 97.6 F (36.4 C)         12/08/2020     1:18 PM 06/14/2021    11:22 AM 09/04/2021    11:33 AM 10/09/2021     2:37 PM   Weight Monitoring   Height 164.6 cm 162.6 cm 162.6 cm    Weight 82.192 kg 84.097  kg 83.643 kg 86.002 kg   BMI (calculated) 30.4 kg/m2 31.9 kg/m2 31.7 kg/m2            General appearance - alert, well appearing, and in no distress  Abdomen - soft, nontender, nondistended, no masses  Skin: No visible rashes  Lungs: non labored breathing         Assessment/Plan:         1. Infertility management    2. History of recurrent miscarriages          We discussed all options:  1) rei referral  2) infertility eval  3) clomid at 50mg  again  4) clomid at 100mg  for 3 months    Wants clomid at 100 now, aware of multiple risk  Wants infertility eval but no imaging  Reports had thrombohilia eval previously due to recurrent miscarriages     Continue Opks  Daily Pnv      Greater than 35 minutes was spent in direct face to face contact with the patient. Greater than 50% of this time was spent counseling and coordinating her care.    Dr Susy [facility] - [facility]

## 2021-10-11 ENCOUNTER — Other Ambulatory Visit: Payer: Self-pay | Admitting: Specialist

## 2021-10-11 DIAGNOSIS — Z319 Encounter for procreative management, unspecified: Secondary | ICD-10-CM

## 2021-10-11 DIAGNOSIS — N96 Recurrent pregnancy loss: Secondary | ICD-10-CM

## 2021-10-15 ENCOUNTER — Other Ambulatory Visit (FREE_STANDING_LABORATORY_FACILITY)

## 2021-10-15 DIAGNOSIS — N96 Recurrent pregnancy loss: Secondary | ICD-10-CM

## 2021-10-15 DIAGNOSIS — Z319 Encounter for procreative management, unspecified: Secondary | ICD-10-CM

## 2021-10-15 LAB — TSH: TSH: 1.37 u[IU]/mL (ref 0.35–4.94)

## 2021-10-17 LAB — ANTIMULLERIAN HORMONE (AMH): Anti Mullerian Hormone (AMH): 2.4 ng/mL (ref 0.03–5.5)

## 2021-10-18 LAB — LUPUS ANTICOAGULANT
PTT-LA Screen: 37 s (ref <=40)
dRVVT Screen: 36 s (ref <=45)

## 2021-10-22 ENCOUNTER — Telehealth (INDEPENDENT_AMBULATORY_CARE_PROVIDER_SITE_OTHER): Admitting: Registered Dietitian Nutritionist

## 2021-10-22 DIAGNOSIS — Z6831 Body mass index (BMI) 31.0-31.9, adult: Secondary | ICD-10-CM

## 2021-10-22 DIAGNOSIS — E6609 Other obesity due to excess calories: Secondary | ICD-10-CM

## 2021-10-22 NOTE — Progress Notes (Signed)
[facility]  EndocrinologyTelemedicine Follow-up  via HIPAA compliant secure video link   Date 10/22/21 (Start time 2:02pm-End Time 2:49pm)  Originating site (Patient location): Home   Distant site (Provider location): [facility]  Provider and Title: Venida Jarvis, RD, CDCES  Consent obtained: Yes (verbal)   Language: English     MNT Initial  Concern: obesity. 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    24- hour recall   - Using Noom app- logging on and off (has been helpful in holding her accountable)  - Breakfast: wheat toast and oatmeal or yogurt   - Lunch: yogurt or avocado toast or sandwich with ham, cheese   - Dinner: chicken, beef or fish with non-starchy vegetable and a starch (1/2 cup or so)   - Sometimes snacks at night   - Eats out 1-2 times per week (enchiladas, [employer] shrimp, pizza)   - Beverages: water and milk sometimes at bedtime  - Has a lot of cravings for sweets around her period     Exercise:  - Walking or jogging, four days per week (2 miles; 5k-10k steps per day)     Discussion  - Healthy Plate Method   - Increasing protein and fiber   - Intermittent fasting  - Was prescribed Contrave weight loss medication but hasn't started yet. Isn't sure she wants to start. Is trying to conceive as well.     Goal:  - Walk dogs twice a week during lunch hour  - Add non-starchy vegetables at lunch     Would like an appointment in a couple of months. Will send her possible dates with other dietitians in our practice.

## 2021-11-14 ENCOUNTER — Other Ambulatory Visit (INDEPENDENT_AMBULATORY_CARE_PROVIDER_SITE_OTHER): Payer: Self-pay | Admitting: Emergency Medicine

## 2021-11-14 NOTE — Telephone Encounter (Signed)
Contrave written by historical provider her last appt with you was discussion about weight loss 5/23. Not sure if she needs appt since you have not wrote this qued temp refill   Last filled July  2023 .   Last appt May  2023 .  Pt does not have an upcoming appointment. .  Queued up 30 day supply with 0 refills.

## 2021-11-14 NOTE — Telephone Encounter (Signed)
Name, strength, directions of requested refill(s):    Contrave 8-90 MG Tablet SR 12 hr per tablet    How much medication is remaining: 5 days of medication     Pharmacy to send refill to or patient to pick up rx from office (mark requested pharmacy in BOLD):      [facility] [place], [place] - [address] [place] [address] [place] [address]  Phone: [phone] Fax: [phone]    [facility] [address] [place], [place] - [address]  [address]  [place] [address]  Phone: [phone] Fax: [phone]        Please mark "X" next to the preferred call back number:    Mobile: [phone] (mobile)    Home: [phone] (home)    Work: @WORKPHONE @        Medication refill request, see above. Thank you   Patient has been informed that medication refill requests should be called in up to one week prior to running out of medication.    Additional Notes:  Next Visit: MM/DD/YY

## 2021-11-15 ENCOUNTER — Encounter (INDEPENDENT_AMBULATORY_CARE_PROVIDER_SITE_OTHER): Payer: Self-pay | Admitting: Internal Medicine

## 2021-11-15 DIAGNOSIS — N926 Irregular menstruation, unspecified: Secondary | ICD-10-CM | POA: Insufficient documentation

## 2021-11-15 DIAGNOSIS — N946 Dysmenorrhea, unspecified: Secondary | ICD-10-CM | POA: Insufficient documentation

## 2021-11-15 DIAGNOSIS — IMO0002 Reserved for concepts with insufficient information to code with codable children: Secondary | ICD-10-CM | POA: Insufficient documentation

## 2021-11-15 DIAGNOSIS — M25579 Pain in unspecified ankle and joints of unspecified foot: Secondary | ICD-10-CM | POA: Insufficient documentation

## 2021-11-15 DIAGNOSIS — F430 Acute stress reaction: Secondary | ICD-10-CM | POA: Insufficient documentation

## 2021-11-15 DIAGNOSIS — M25552 Pain in left hip: Secondary | ICD-10-CM | POA: Insufficient documentation

## 2021-11-15 DIAGNOSIS — M25569 Pain in unspecified knee: Secondary | ICD-10-CM | POA: Insufficient documentation

## 2021-11-15 DIAGNOSIS — H5200 Hypermetropia, unspecified eye: Secondary | ICD-10-CM | POA: Insufficient documentation

## 2021-11-15 DIAGNOSIS — R221 Localized swelling, mass and lump, neck: Secondary | ICD-10-CM | POA: Insufficient documentation

## 2021-11-15 DIAGNOSIS — M84369A Stress fracture, unspecified tibia and fibula, initial encounter for fracture: Secondary | ICD-10-CM | POA: Insufficient documentation

## 2021-11-15 NOTE — Telephone Encounter (Signed)
LOV 09/04/21 for weight loss  Prescribed contrave but delayed starting med  Rec FU 12/05/21  Last CPX 12/08/20

## 2021-11-17 NOTE — Progress Notes (Signed)
[facility] VISIT               Patient presents for an annual examination    Obesity  Started Contrave 8 - 90 1 tab daily in 7 2023  Saw a nutrionist  Has lost a little wt  Wld like to cont contrave another 1-2 months- last 5 lbs so far  Obesity  Will pres contrave 2 tabs bid    12/08/2020  [facility] - Sherlynn Stalls, Porfirio Oar, FNP  Family Nurse Practitioner Annual physical exam +5 more  Dx Annual Exam  Reason for Visit       Works:  finance mgt    PMHX:  Past Medical History:   Diagnosis Date    Eczema     childhood    Left upper arm pain 12/08/2020- resolved         10/09/2021   [facility]    Nicholes Rough, DO  Obstetrics and Gynecology Infertility management +1 more  Dx Consult (Initial)   Reason for Visit     PSHX:  Past Surgical History:   Procedure Laterality Date    APPENDECTOMY (OPEN)      FOOT SURGERY Right 08/2019    neuroma       Social:  Married 1 son  Smoking history: never  Illicit Drug Use history: never  Alcohol history: not since on occ  Exercising history: jogs 3x/week walks 2x/wk "sprints" 1x/week when she walks her dog    Diet: good  Supplements: prenatal    Family medical history:  Father: deceased from mi in early 57s  Mother: hypoglycemia knee surgeries  Brother (s): 1 younger- RA  Sister (s): 0  Family history of early mis or cvas: father mgf had mi in his 53s  Family history of early breast/ovarian/uterine/colon cancer or colon polyps: mgm breast cancer dx in her 30s - had bl mastectomy    Preventive tests:  Immunization History   Administered Date(s) Administered    COVID-19 mRNA MONOVALENT vaccine PRIMARY SERIES 12 years and above [employer]) 30 mcg/0.3 mL (DILUTE BEFORE USE) 07/23/2019, 08/13/2019, 04/13/2020    Hepatitis B (Adult) 08/09/2016, 09/11/2016, 12/05/2016    Influenza quad 6 MOS to 64 YRS (Flulaval/Fluarix) 12/08/2020    Tdap 06/23/2017         Preventive tests:  Obgyn: Dr Carlena Bjornstad  Mammography:started getting mammos at  38  Pap smear: plans to get it from obgyn  Colonoscopy: never- rec at 43      Physical Exam  Constitutional:       Appearance: Normal appearance.   HENT:      Head: Normocephalic and atraumatic.   Cardiovascular:      Rate and Rhythm: Normal rate and regular rhythm.   Pulmonary:      Effort: Pulmonary effort is normal.   Abdominal:      Tenderness: There is no abdominal tenderness.   Neurological:      Mental Status: She is alert.   Psychiatric:         Behavior: Behavior normal.          1. Obesity (BMI 30-39.9)  naltrexone-buPROPion HCl ER (Contrave) 8-90 MG Tablet SR 12 hr per tablet            Assessment and Plan is included incorporated in the above note.

## 2021-11-22 ENCOUNTER — Ambulatory Visit (INDEPENDENT_AMBULATORY_CARE_PROVIDER_SITE_OTHER): Admitting: Internal Medicine

## 2021-11-22 ENCOUNTER — Encounter (INDEPENDENT_AMBULATORY_CARE_PROVIDER_SITE_OTHER): Payer: Self-pay | Admitting: Internal Medicine

## 2021-11-22 VITALS — BP 106/71 | HR 69 | Temp 98.7°F | Ht 64.0 in | Wt 182.0 lb

## 2021-11-22 DIAGNOSIS — E669 Obesity, unspecified: Secondary | ICD-10-CM

## 2021-11-22 MED ORDER — CONTRAVE 8-90 MG PO TB12
2.0000 | ORAL_TABLET | Freq: Two times a day (BID) | ORAL | 0 refills | Status: DC
Start: 2021-11-22 — End: 2022-11-27

## 2021-11-22 NOTE — Progress Notes (Signed)
Have you seen any specialists/other providers since your last visit with [place]?    Yes, Endocrinologist    Arm preference verified?   Yes, no preference    Health Maintenance Due   Topic Date Due    Cervical Cancer Screening Three Years  Never done    HEPATITIS C SCREENING  Never done

## 2021-12-06 ENCOUNTER — Encounter (INDEPENDENT_AMBULATORY_CARE_PROVIDER_SITE_OTHER): Payer: Self-pay | Admitting: Internal Medicine

## 2021-12-07 ENCOUNTER — Encounter (INDEPENDENT_AMBULATORY_CARE_PROVIDER_SITE_OTHER): Payer: Self-pay | Admitting: Family

## 2021-12-11 ENCOUNTER — Encounter (INDEPENDENT_AMBULATORY_CARE_PROVIDER_SITE_OTHER): Payer: Self-pay | Admitting: Internal Medicine

## 2021-12-21 NOTE — Progress Notes (Signed)
[facility] VISIT    Cheyenne Rangel  is a 41 y.o.  female.      Patient presents for an annual examination    Planning to travel for work to [place]  Will be there on 11 13 2023 until 11 17 2023- 5 days  Will send in Malarone 14 tabs- start 2 days before arriving and stop 7 days after returning      Obesity 8 10 23   Started Contrave 8 - 90 1 tab daily in 7 2023  Saw a nutrionist  Has lost a little wt  Wld like to cont contrave another 1-2 months- last 5 lbs so far  Obesity  Will pres contrave 2 tabs bid  9 13 2023- curr on contrave and decreases her appetite has lost about 16 lbs    Cpe 8 26 22       Works:  Geophysicist/field seismologist    PMHX:  Past Medical History:   Diagnosis Date    Eczema     childhood    Left upper arm pain 12/08/2020- resolved         10/09/2021  [facility]    Nicholes Rough, DO  Obstetrics and Gynecology Infertility management +1 more  Dx Consult (Initial)   Reason for Visit     PSHX:  Past Surgical History:   Procedure Laterality Date    APPENDECTOMY (OPEN)      FOOT SURGERY Right 08/2019    neuroma       Social:  Married 1 son  Smoking history: never  Illicit Drug Use history: never  Alcohol history:  on occ  Exercising history: jogs 3x/week walks 2x/wk "sprints" 1x/week when she walks her dog    Diet: good  Supplements: prenatal    Family medical history:  Father: deceased from mi in early 58s  Mother: hypoglycemia knee surgeries  Brother (s): 1 younger- RA  Sister (s): 0  Family history of early mis or cvas: father mgf had mi in his 65s  Family history of early breast/ovarian/uterine/colon cancer or colon polyps: mgm breast cancer dx in her 30s - had bl mastectomy    Preventive tests:    Immunization History   Administered Date(s) Administered    COVID-19 mRNA MONOVALENT vaccine PRIMARY SERIES 12 years and above [employer]) 30 mcg/0.3 mL (DILUTE BEFORE USE) 07/23/2019, 08/13/2019, 04/13/2020    Hepatitis B (Adult) 08/09/2016, 09/11/2016,  12/05/2016    Influenza quad 6 MOS to 64 YRS (Flulaval/Fluarix) 12/08/2020    Tdap 06/23/2017     Flu shot 9 13 2023      Preventive tests:  Obgyn: Dr Carlena Bjornstad  Mammography:started getting mammos at 38  Pap smear: plans to get it from obgyn  Colonoscopy: never- rec at 43    Physical Exam  Constitutional:       Appearance: Normal appearance.   HENT:      Head: Normocephalic and atraumatic.   Cardiovascular:      Rate and Rhythm: Normal rate and regular rhythm.   Pulmonary:      Effort: Pulmonary effort is normal.   Abdominal:      Tenderness: There is no abdominal tenderness.   Neurological:      Mental Status: She is alert.   Psychiatric:         Behavior: Behavior normal.       1. Physical exam  CBC without differential    Comprehensive metabolic panel  Lipid panel    TSH    Hemoglobin A1C    Hepatitis C (HCV) antibody, Total      2. Need for influenza vaccination  Flu vaccine QUADRIVALENT (PF) 6 months and older (FLULAVAL/FLUARIX)      3. Antibody response exam  Varicella Zoster Antibody, IgG    Mumps antibody, IgG    Rubeola antibody IgG    Rubella antibody, IgG    Poliovirus Types 1,3 Abs Neutralization    Hepatitis A antibody, IGG    Hepatitis B (HBV) Surface Antibody Quant      4. Need for malaria prophylaxis  atovaquone-proguanil (MALARONE) 250-100 MG Tab      5. Obesity (BMI 30-39.9)               Physical   Health maintenance  - I rec 150 min moderate aerobic activity / week and 2 sessions of resistance training  - I rec use of sunscreen at least spf 35   - I rec seeing a dds every 6 months and an eye doctor ever year  - I rec the Mediterranean diet      High BMI Follow Up  BMI Follow Up Care Plan Documented  Encouragement to Exercise    Body mass index is 31.27 kg/m.  Discussed the patient's BMI with her.  The BMI is above average; BMI management plan is completed  Diet, exer and wt loss.          Assessment and Plan is included incorporated in the above note.

## 2021-12-26 ENCOUNTER — Ambulatory Visit (INDEPENDENT_AMBULATORY_CARE_PROVIDER_SITE_OTHER): Admitting: Internal Medicine

## 2021-12-26 ENCOUNTER — Encounter (INDEPENDENT_AMBULATORY_CARE_PROVIDER_SITE_OTHER): Payer: Self-pay | Admitting: Internal Medicine

## 2021-12-26 VITALS — BP 120/77 | HR 76 | Temp 97.7°F | Ht 64.0 in | Wt 182.2 lb

## 2021-12-26 DIAGNOSIS — Z0184 Encounter for antibody response examination: Secondary | ICD-10-CM

## 2021-12-26 DIAGNOSIS — Z23 Encounter for immunization: Secondary | ICD-10-CM

## 2021-12-26 DIAGNOSIS — E669 Obesity, unspecified: Secondary | ICD-10-CM

## 2021-12-26 DIAGNOSIS — Z298 Encounter for other specified prophylactic measures: Secondary | ICD-10-CM

## 2021-12-26 DIAGNOSIS — Z0000 Encounter for general adult medical examination without abnormal findings: Secondary | ICD-10-CM

## 2021-12-26 LAB — CBC
Absolute NRBC: 0 10*3/uL (ref 0.00–0.00)
Hematocrit: 41.2 % (ref 34.7–43.7)
Hgb: 13.3 g/dL (ref 11.4–14.8)
MCH: 30 pg (ref 25.1–33.5)
MCHC: 32.3 g/dL (ref 31.5–35.8)
MCV: 92.8 fL (ref 78.0–96.0)
MPV: 9.9 fL (ref 8.9–12.5)
Nucleated RBC: 0 /100 WBC (ref 0.0–0.0)
Platelets: 394 10*3/uL — ABNORMAL HIGH (ref 142–346)
RBC: 4.44 10*6/uL (ref 3.90–5.10)
RDW: 12 % (ref 11–15)
WBC: 7.41 10*3/uL (ref 3.10–9.50)

## 2021-12-26 LAB — COMPREHENSIVE METABOLIC PANEL
ALT: 19 U/L (ref 0–55)
AST (SGOT): 20 U/L (ref 5–41)
Albumin/Globulin Ratio: 1.5 (ref 0.9–2.2)
Albumin: 4.1 g/dL (ref 3.5–5.0)
Alkaline Phosphatase: 47 U/L (ref 37–117)
Anion Gap: 8 (ref 5.0–15.0)
BUN: 10 mg/dL (ref 7.0–21.0)
Bilirubin, Total: 0.3 mg/dL (ref 0.2–1.2)
CO2: 28 mEq/L (ref 17–29)
Calcium: 9.4 mg/dL (ref 8.5–10.5)
Chloride: 104 mEq/L (ref 99–111)
Creatinine: 0.8 mg/dL (ref 0.4–1.0)
Globulin: 2.8 g/dL (ref 2.0–3.6)
Glucose: 96 mg/dL (ref 70–100)
Potassium: 4.6 mEq/L (ref 3.5–5.3)
Protein, Total: 6.9 g/dL (ref 6.0–8.3)
Sodium: 140 mEq/L (ref 135–145)
eGFR: >60 mL/min/{1.73_m2} (ref >=60)

## 2021-12-26 LAB — LIPID PANEL
Cholesterol / HDL Ratio: 4.6 Index
Cholesterol: 213 mg/dL — ABNORMAL HIGH (ref 0–199)
HDL: 46 mg/dL (ref 40–9999)
LDL Calculated: 153 mg/dL — ABNORMAL HIGH (ref 0–99)
Triglycerides: 69 mg/dL (ref 34–149)
VLDL Calculated: 14 mg/dL (ref 10–40)

## 2021-12-26 LAB — HEMOGLOBIN A1C
Average Estimated Glucose: 99.7 mg/dL
Hemoglobin A1C: 5.1 % (ref 4.6–5.6)

## 2021-12-26 LAB — HEPATITIS A ANTIBODY, IGG: Hepatitis A Ab, IGG: NONREACTIVE

## 2021-12-26 LAB — HEPATITIS C ANTIBODY, TOTAL: Hepatitis C, AB: NONREACTIVE

## 2021-12-26 LAB — VARICELLA ZOSTER VIRUS (VZV) ANTIBODY, IGG: Varicella, IgG: 295.4 Index

## 2021-12-26 LAB — MUMPS ANTIBODY, IGG: Mumps Ab, IgG: 46.8 AU/mL

## 2021-12-26 LAB — RUBEOLA ANTIBODY, IGG: Rubeola (Measles), IgG: 39 AU/mL

## 2021-12-26 LAB — TSH: TSH: 1.56 u[IU]/mL (ref 0.35–4.94)

## 2021-12-26 LAB — RUBELLA ANTIBODY, IGG: Rubella AB, IgG: 1.44 Index

## 2021-12-26 LAB — HEPATITIS B SURFACE (HBV) ANTIBODY, QUANTITATIVE: HEPATITIS B SURFACE ANTIBODY: 32.04 m[IU]/mL

## 2021-12-26 LAB — HEMOLYSIS INDEX(SOFT): Hemolysis Index: 5 Index (ref 0–24)

## 2021-12-26 MED ORDER — ATOVAQUONE-PROGUANIL HCL 250-100 MG PO TABS
1.0000 | ORAL_TABLET | Freq: Every day | ORAL | 0 refills | Status: AC
Start: 2021-12-26 — End: 2022-01-09

## 2021-12-27 ENCOUNTER — Other Ambulatory Visit (INDEPENDENT_AMBULATORY_CARE_PROVIDER_SITE_OTHER): Payer: Self-pay | Admitting: Internal Medicine

## 2021-12-27 DIAGNOSIS — R7989 Other specified abnormal findings of blood chemistry: Secondary | ICD-10-CM

## 2022-01-02 LAB — POLIOVIRUS TYPES 1,3 ANTIBODIES NEUTRALIZATION
Polio 1 Titer: 1:128 {titer}
Polio 3 Titer: 1:128 {titer}

## 2022-01-04 ENCOUNTER — Telehealth (INDEPENDENT_AMBULATORY_CARE_PROVIDER_SITE_OTHER): Admitting: Registered"

## 2022-01-04 ENCOUNTER — Encounter (INDEPENDENT_AMBULATORY_CARE_PROVIDER_SITE_OTHER): Payer: Self-pay | Admitting: Registered"

## 2022-01-04 DIAGNOSIS — Z6831 Body mass index (BMI) 31.0-31.9, adult: Secondary | ICD-10-CM

## 2022-01-04 DIAGNOSIS — E6609 Other obesity due to excess calories: Secondary | ICD-10-CM

## 2022-01-04 NOTE — Progress Notes (Signed)
[facility]  EndocrinologyTelemedicine Follow-up  via HIPAA compliant secure video link   Date 01/04/22 (Start time 1258 pm-End Time 1322)  Originating site (Patient location): Home   Distant site (Provider location): [facility]  Provider and Title: Lysbeth Penner, RD CDCES  Consent obtained: Yes (verbal)   Language: English     MNT Follow up:  Concern: obesity. 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 Water  - snacks: sometimes grapes or nuts  - Dinner 7/730pm: chicken, beef or fish with non-starchy vegetable and a starch (1/2 cup or so)   - Sometimes snacks at night: popsicle or low carb ice  cream  - Eats out 1-2 times per week (enchiladas, [place] Ts[employer] shrimp, pizza)   - Beverages: water and milk sometimes at bedtime  - Has a lot of cravings for sweets around her period     Exercise:  - Walking or jogging, 2-3 days per week (2 mile joy; walks to pick child up 1 mile)     Discussion  - Pt started contrave wt loss medication as she has pushed back fertility treatment.   - reports contrave has helped manage appetite, especially around her period. Eliminated snacking after dinner.   Healthy Plate Method   - Increasing protein and fiber   - Intermittent fasting (eats between 1pm-10pm)  - Was prescribed Contrave weight loss medication but hasn't started yet. Isn't sure she wants to start. Is trying to conceive as well.     Goal:  - gradual wt loss to goal wt of 165lb    Plan: Pt to follow up as needed. Discussed [facility] wt loss clinic as option as well

## 2022-01-23 ENCOUNTER — Encounter (INDEPENDENT_AMBULATORY_CARE_PROVIDER_SITE_OTHER): Payer: Self-pay | Admitting: Internal Medicine

## 2022-01-24 ENCOUNTER — Other Ambulatory Visit (INDEPENDENT_AMBULATORY_CARE_PROVIDER_SITE_OTHER): Payer: Self-pay | Admitting: Internal Medicine

## 2022-01-24 DIAGNOSIS — Z1231 Encounter for screening mammogram for malignant neoplasm of breast: Secondary | ICD-10-CM

## 2022-03-15 ENCOUNTER — Ambulatory Visit

## 2022-03-20 ENCOUNTER — Ambulatory Visit
Admission: RE | Admit: 2022-03-20 | Discharge: 2022-03-20 | Disposition: A | Discharge status: Home / Self Care | Source: Ambulatory Visit | Attending: Internal Medicine | Admitting: Internal Medicine

## 2022-03-20 DIAGNOSIS — Z1231 Encounter for screening mammogram for malignant neoplasm of breast: Secondary | ICD-10-CM | POA: Insufficient documentation

## 2022-06-27 ENCOUNTER — Other Ambulatory Visit (FREE_STANDING_LABORATORY_FACILITY)

## 2022-06-27 DIAGNOSIS — R7989 Other specified abnormal findings of blood chemistry: Secondary | ICD-10-CM

## 2022-06-27 LAB — PLATELET COUNT: Platelets: 413 10*3/uL — ABNORMAL HIGH (ref 142–346)

## 2022-06-28 ENCOUNTER — Other Ambulatory Visit (INDEPENDENT_AMBULATORY_CARE_PROVIDER_SITE_OTHER): Payer: Self-pay | Admitting: Internal Medicine

## 2022-06-28 DIAGNOSIS — R7989 Other specified abnormal findings of blood chemistry: Secondary | ICD-10-CM

## 2022-08-28 ENCOUNTER — Encounter (INDEPENDENT_AMBULATORY_CARE_PROVIDER_SITE_OTHER): Payer: Self-pay

## 2022-08-28 ENCOUNTER — Ambulatory Visit (INDEPENDENT_AMBULATORY_CARE_PROVIDER_SITE_OTHER): Admitting: Specialist

## 2022-08-28 ENCOUNTER — Encounter (INDEPENDENT_AMBULATORY_CARE_PROVIDER_SITE_OTHER): Payer: Self-pay | Admitting: Specialist

## 2022-08-28 VITALS — BP 120/83 | HR 69 | Temp 98.4°F | Wt 191.0 lb

## 2022-08-28 DIAGNOSIS — Z319 Encounter for procreative management, unspecified: Secondary | ICD-10-CM

## 2022-08-28 DIAGNOSIS — E663 Overweight: Secondary | ICD-10-CM

## 2022-08-28 DIAGNOSIS — Z01411 Encounter for gynecological examination (general) (routine) with abnormal findings: Secondary | ICD-10-CM

## 2022-08-28 NOTE — Progress Notes (Signed)
Chief Complaint   Patient presents with    Annual Exam     42 y/o [phone] present in office today for an annual exam. Last mammogram 03/2022-normal. Last pap smear per pt 06/2018-normal.        Cheyenne Rangel is a 42 y.o. [phone], Patient's last menstrual period was 07/29/2022 (approximate). Here today for annual gyn visit.  Pt denies any abdominal pain, abnormal vaginal bleeding, discharge/itching.          3 cycles clomid 50mg   Opk + for 2 cycles  No pregnancy     OB History       Gravida   7    Para   1    Term   1    Preterm        AB   6    Living   1         SAB   6    IAB   0    Ectopic        Multiple        Live Births   1                   PMH:     Past Medical History:   Diagnosis Date    Eczema     childhood    Left upper arm pain 12/08/2020         PSH:     Past Surgical History:   Procedure Laterality Date    APPENDECTOMY (OPEN)      FOOT SURGERY Right 08/2019    neuroma         Family History   Problem Relation Age of Onset    Seizures Mother     Other Mother         hypoglycemia    Heart attack Father 47    Breast cancer Maternal Grandmother     Cancer Maternal Grandmother         breast    Heart attack Maternal Grandfather     Stroke Maternal Grandfather     Rheum arthritis Brother     No known problems Son            SH:  Social History     Socioeconomic History    Marital status: Married   Tobacco Use    Smoking status: Never     Passive exposure: Never    Smokeless tobacco: Never   Vaping Use    Vaping status: Never Used   Substance and Sexual Activity    Alcohol use: Yes     Comment: occasionally    Drug use: Never    Sexual activity: Yes     Partners: Male     Birth control/protection: None     Social Determinants of Health     Financial Resource Strain: Low Risk  (08/23/2022)    Overall Financial Resource Strain (CARDIA)     Difficulty of Paying Living Expenses: Not hard at all   Food Insecurity: No Food Insecurity (08/23/2022)    Hunger Vital Sign     Worried About Running Out of Food in  the Last Year: Never true     Ran Out of Food in the Last Year: Never true   Transportation Needs: No Transportation Needs (08/23/2022)    PRAPARE - Therapist, art (Medical): No     Lack of Transportation (Non-Medical): No   Physical Activity: Insufficiently Active (  08/23/2022)    Exercise Vital Sign     Days of Exercise per Week: 3 days     Minutes of Exercise per Session: 40 min   Stress: No Stress Concern Present (08/23/2022)    [employer] of Occupational Health - Occupational Stress Questionnaire     Feeling of Stress : Not at all   Social Connections: Moderately Isolated (08/23/2022)    Social Connection and Isolation Panel [NHANES]     Frequency of Communication with Friends and Family: Twice a week     Frequency of Social Gatherings with Friends and Family: Never     Attends Religious Services: Never     Database administrator or Organizations: Yes     Attends Engineer, structural: More than 4 times per year     Marital Status: Married   Catering manager Violence: Not At Risk (08/23/2022)    Humiliation, Afraid, Rape, and Kick questionnaire     Fear of Current or Ex-Partner: No     Emotionally Abused: No     Physically Abused: No     Sexually Abused: No   Housing Stability: Low Risk  (08/23/2022)    Housing Stability Vital Sign     Unable to Pay for Housing in the Last Year: No     Number of Places Lived in the Last Year: 1     Unstable Housing in the Last Year: No         No Known Allergies        Current Outpatient Medications:     Prenatal Vit-Fe Fumarate-FA (PRENATAL VITAMINS PO), Take by mouth, Disp: , Rfl:     clomiPHENE (CLOMID) 50 MG tablet, TAKE 2 TABLETS BY MOUTH ONCE DAILY FOR 15 DAYS TAKE FROM CYCLE DAY 5 TO CYCLE DAY 10 (Patient not taking: Reported on 11/22/2021), Disp: , Rfl:     naltrexone-buPROPion HCl ER (Contrave) 8-90 MG Tablet SR 12 hr per tablet, Take 2 tablets by mouth 2 (two) times daily, Disp: 240 tablet, Rfl: 0                  Review of Systems  -  General ROS: negative for fatigue, fever/chills, weight loss  Breast ROS: negative for breast lumps, nipple discharge  Gastrointestinal ROS: no abdominal pain, change in bowel habits  Genitourinary ROS: no dysuria, trouble voiding, or hematuria  Skin: denies rash or lesion  Psych: denies depression or anxiety  All other systems were reviewed and are negative except as previously noted in the hpi.  No Known Allergies    O:    BP 120/83   Pulse 69   Temp 98.4 F (36.9 C)   Wt 191 lb (86.6 kg)   LMP 07/29/2022 (Approximate)   BMI 32.79 kg/m     General appearance - alert, well appearing, and in no distress  Breasts: breasts appear normal, symmetrical, no suspicious masses, no skin or nipple changes or axillary nodes, no nipple discharge  Abdomen - soft, nontender, nondistended, no masses  Extremities: no signs of clubbing or edema     Pelvic exam:  VULVA: normal appearing vulva with no masses, tenderness or lesions, normal clitoris  VAGINA: normal appearing vagina with normal color and discharge, no lesions,  CERVIX: normal appearing cervix without discharge or lesions,  UTERUS: uterus is normal size, shape, consistency and nontender,   ADNEXA:  nontender and no masses.          Assessment/Plan:  1. Encounter for gynecological examination with abnormal finding  - ThinPrep Imaging Pap & HPV mRNA E6/E7 rflx HPV 16,18/45  - Mammo Screening 3D/Tomo Bilateral; Future    2. Infertility management    3. Overweight  - Referral to Bariatric Surgery and Obesity Medicine ([place]); Future      Referral to rei placed   -  Well woman exam completed  -  Next mammogram order discussed  -  Follow up in 1 year or prn.    - All questions answered                   Dr Susy [facility] - [facility]

## 2022-09-02 LAB — THINPREP IMAGING PAP & HPV MRNA E6/E7 RFLX HPV 16,18/45: HPV RNA, High Risk, E6-E7, TMA: NOT DETECTED

## 2022-10-15 ENCOUNTER — Other Ambulatory Visit (FREE_STANDING_LABORATORY_FACILITY)

## 2022-10-15 DIAGNOSIS — R7989 Other specified abnormal findings of blood chemistry: Secondary | ICD-10-CM

## 2022-10-16 LAB — PLATELET COUNT
MPV: 10 fL (ref 8.9–12.5)
Platelet Count: 443 10*3/uL — ABNORMAL HIGH (ref 142–346)

## 2022-11-12 ENCOUNTER — Ambulatory Visit (INDEPENDENT_AMBULATORY_CARE_PROVIDER_SITE_OTHER): Admitting: Internal Medicine

## 2022-11-12 ENCOUNTER — Encounter (INDEPENDENT_AMBULATORY_CARE_PROVIDER_SITE_OTHER): Payer: Self-pay | Admitting: Internal Medicine

## 2022-11-12 DIAGNOSIS — J020 Streptococcal pharyngitis: Secondary | ICD-10-CM

## 2022-11-12 DIAGNOSIS — J029 Acute pharyngitis, unspecified: Secondary | ICD-10-CM

## 2022-11-12 LAB — POCT RAPID STREP A: Rapid Strep A Screen POCT: POSITIVE — AB

## 2022-11-12 MED ORDER — AMOXICILLIN 875 MG PO TABS
875.0000 mg | ORAL_TABLET | Freq: Two times a day (BID) | ORAL | 0 refills | Status: AC
Start: 2022-11-12 — End: 2022-11-22

## 2022-11-12 NOTE — Progress Notes (Signed)
[facility] [facility]   OFFICE VISIT            HPI         [place] [facility] OFFICE VISIT      Patient presents for sore throat    Patient presents for a sick visit  Flew to [place] on 7 20 2024  Patient started feeling sick on 7 20 2024 with sore throat and left eye discharge  Did telemed visit on 7 21 2024 was diagnosed was conjunctiviist and rpe vigamooss - took it and eye disparage reoslve  Felw bac on 7 23 2024  Still has a sore throat- burning and tight, location, lower throat, hurts to swallow, currently 2/10 worse at night 7/10  No fevers, chills, sweats, myalgias.  No throat congestion, sinus congestion or post nasal drip  Taking dayquil nyquil trhoat lozenges resting and drinking fluids, mouth wash  Still has a sore throat, and it hurts to swallow  Started coughing a few days ago, dry, here and there    Pharyngitis  - poct strep pos  - throat cx  - for sore throat pain, I rec gargling with warm salt water/nsaids/tylenol/throat lozenges prn  - period is late      Risk & Benefits of the new medication(s) were explained to the patient (and family) who verbalized understanding & agreed to the treatment plan. Patient (family) encouraged to contact me/clinical staff with any questions/concerns    Cpe 8 26 22; 9 13 2023      Works:  Geophysicist/field seismologist    PMHX:      9 13 2023 Planning to travel for work to [place]  Will be there on 11 13 2023 until 11 17 2023- 5 days  Will send in Malarone 14 tabs- start 2 days before arriving and stop 7 days after returning        Past Medical History:   Diagnosis Date    Eczema     childhood    Left upper arm pain 12/08/2020- resolved         10/09/2021  [facility]    Nicholes Rough, DO  Obstetrics and Gynecology Infertility management +1 more  Dx Consult (Initial)   Reason for Visit     Obesity 8 10 23   Started Contrave 8 - 90 1 tab daily in 7 2023  Saw a nutrionist  Has lost a little wt  Wld like to cont contrave another 1-2 months- last 5  lbs so far  Obesity  Will pres contrave 2 tabs bid  9 13 2023- curr on contrave and decreases her appetite has lost about 16 lbs    Past Surgical History:   Procedure Laterality Date    APPENDECTOMY (OPEN)      FOOT SURGERY Right 08/2019    neuroma       Social:  Married 1 son  Smoking history: never  Illicit Drug Use history: never  Alcohol history:  on occ  Exercising history: jogs 3x/week walks 2x/wk "sprints" 1x/week when she walks her dog    Diet: good  Supplements: prenatal    Family medical history:  Father: deceased from mi in early 92s  Mother: hypoglycemia knee surgeries  Brother (s): 1 younger- RA  Sister (s): 0  Family history of early mis or cvas: father mgf had mi in his 55s  Family history of early breast/ovarian/uterine/colon cancer or colon polyps: mgm breast cancer dx in her 57s - had  bl mastectomy    Preventive tests:    Immunization History   Administered Date(s) Administered    COVID-19 mRNA MONOVALENT vaccine PRIMARY SERIES 12 years and above [employer]) 30 mcg/0.3 mL (DILUTE BEFORE USE) 07/23/2019, 08/13/2019, 04/13/2020    Hepatitis B (Adult) 08/09/2016, 09/11/2016, 12/05/2016    Influenza quad 6 MOS to 64 YRS (Flulaval/Fluarix) 12/08/2020    Tdap 06/23/2017     Flu shot 9 13 2023      Preventive tests:  Obgyn: Dr Carlena Bjornstad  Mammography:started getting mammos at 38  Pap smear: plans to get it from obgyn  Colonoscopy: never- rec at 60                 Physical Exam   Physical Exam  Constitutional:       Appearance: Normal appearance.   Cardiovascular:      Rate and Rhythm: Normal rate.   Pulmonary:      Effort: Pulmonary effort is normal.   Abdominal:      Tenderness: There is no abdominal tenderness.   Neurological:      Mental Status: She is alert.   Psychiatric:         Behavior: Behavior normal.     Congested    Op red            Assessment/Plan       1. Sore throat  POCT Rapid Group A Strep    POCT Rapid Group A Strep    CANCELED: Culture, Throat    CANCELED: Culture, Throat      2. Strep throat   amoxicillin (AMOXIL) 875 MG tablet          Assessment and Plan is included incorporated in the above note.

## 2022-11-27 ENCOUNTER — Ambulatory Visit (INDEPENDENT_AMBULATORY_CARE_PROVIDER_SITE_OTHER): Admitting: Internal Medicine

## 2022-11-27 ENCOUNTER — Encounter (INDEPENDENT_AMBULATORY_CARE_PROVIDER_SITE_OTHER): Payer: Self-pay | Admitting: Internal Medicine

## 2022-11-27 VITALS — BP 122/84 | HR 65 | Ht 64.0 in | Wt 205.0 lb

## 2022-11-27 DIAGNOSIS — E669 Obesity, unspecified: Secondary | ICD-10-CM

## 2022-11-27 DIAGNOSIS — N979 Female infertility, unspecified: Secondary | ICD-10-CM

## 2022-11-27 NOTE — Progress Notes (Signed)
The patient presents today for a consult encounter to evaluate for services at [facility].     Cheyenne Rangel is a 42 y.o. year old female presenting for evaluation and is interested in non-surgical weight management.     The patient has struggled with their weight since around puberty, did her first diet around age 22-14.   Weight: 205 lb  Highest adult weight: current weight  Was this weight in 2013-2014, got down to 155 lb or so after going through a divorce, was going to the gym a lot and taking care of herself. Maintained for a couple of years.   Started to gain when she was going through fertility treatments.   In the last year had gotten down to around 184 lb, was on Contrave, but stopped because she wanted to try to get pregnant again.   Most comfortable weight: 150-160   Goal weight is 150-160    Previous AOM tried? Contrave- did work  to some degree.  Also did naltrexone by itself for a few months.     Eating patterns and dietary concerns: felt less hungry with contrave    Current Execise: not always consistent due to time, taking care of her son. Walking when she can     The patient has the following weight related comorbid conditions:  Problem List[1]  Past Surgical History:   Procedure Laterality Date    APPENDECTOMY (OPEN)      FOOT SURGERY Right 08/2019    neuroma     Family History   Problem Relation Age of Onset    Seizures Mother     Other Mother         hypoglycemia    Heart attack Father 29    Breast cancer Maternal Grandmother     Cancer Maternal Grandmother         breast    Heart attack Maternal Grandfather     Stroke Maternal Grandfather     Rheum arthritis Brother     No known problems Son      Current Medications[2]    Obesity ROS:  Contraindications to weight loss medications:   Nephrolithiasis: No  Seizures: No  Personal history of pancreatitis: No  Glaucoma: No    Personal history of  Heart attack: No  CVA/TIA:No  PAD:No    Diagnosis of OSA: No  If yes, compliant with CPAP:  N/A  Symptoms of sleep apnea (daytime somnolence, snoring, witnessed apnea): n/a    History of psychiatric treatment: No  History of drug abuse: No  History of or current alcohol use: No  History of disordered eating requiring treatment: No  Concerns about disordered eating behavior now: No    Currently has pregnancy potential: YES- not on contraception. Counseled regarding risks of pregnancy on medication  History of issues with PCOS symptoms (hirsuitism, acne, or menstrual irregularity): No  History of treatment for PCOS: No    Family history of medullary thyroid cancer: No  Family hx of T2DM: none    General ROS:  ROS    Physical Exam:  BP 122/84   Pulse 65   Ht 5\' 4"    Wt 205 lb   BMI 35.19 kg/m   Physical Exam  Constitutional:       General: She is not in acute distress.     Appearance: Normal appearance. She is not ill-appearing.   HENT:      Head: Normocephalic and atraumatic.   Neck:  Thyroid: No thyroid mass, thyromegaly or thyroid tenderness.   Cardiovascular:      Rate and Rhythm: Normal rate. No extrasystoles are present.     Heart sounds: S1 normal and S2 normal.   Pulmonary:      Effort: Pulmonary effort is normal.      Breath sounds: Normal air entry.   Musculoskeletal:      Right lower leg: No edema.      Left lower leg: No edema.   Neurological:      General: No focal deficit present.      Mental Status: She is alert.      Gait: Gait is intact.   Psychiatric:         Attention and Perception: Attention normal.         Mood and Affect: Affect normal.         Speech: Speech normal.         Behavior: Behavior normal.         Thought Content: Thought content normal.         Judgment: Judgment normal.          Lab Results   Component Value Date    HGBA1C 5.1 12/26/2021    HGBA1C 4.9 12/12/2020      Creatinine   Date Value Ref Range Status   12/26/2021 0.8 0.4 - 1.0 mg/dL Final     LDL Calculated   Date Value Ref Range Status   12/26/2021 153 (H) 0 - 99 mg/dL Final     Triglycerides   Date Value  Ref Range Status   12/26/2021 69 34 - 149 mg/dL Final     TSH   Date Value Ref Range Status   12/26/2021 1.56 0.35 - 4.94 uIU/mL Final        Assessment:    Comorbidity risks:  Stop-Bang Score: 1    NAFLD scores:  NAFLD Score: -4.42    FIB4 SCORE: 0.45       The 10-year ASCVD risk score (Arnett DK, et al., 2019) is: 1%    Values used to calculate the score:      Age: 33 years      Sex: Female      Is Non-Hispanic African American: No      Diabetic: No      Tobacco smoker: No      Systolic Blood Pressure: 122 mmHg      Is BP treated: No      HDL Cholesterol: 46 mg/dL      Total Cholesterol: 213 mg/dL    Need to address comorbid conditions/risks?  Sleep: No  NAFLD: No  Lipids: No  CHF: No  Diabetes: No  Genetic testing recommended: No    Weight loss med Contraindications: No  Current home medications that may promote weight gain:  none    1. Infertility, female  The patient made this appointment before deciding to do IVF, but we can certainly start medication if/when she feels it is appropriate after her fertility journey/future pregnancy      2. Class II obesity  Offered behavioral resources for now, could consider medicines in the future. With [facility] will have to trial oral medications before they will cover GLP1a.   Referral to Bariatric Surgery and Obesity Medicine ([facility])           PLAN:   Currently the patient plans to pursue IVF, so has to remain off of medication  Could consider phentermine and Qsymia, another  trial of Contrave/components  Discussed RD, Exercise, behavioral health. Will do nutrition class    Plan given to patient today: Please see AVS    Return for nutrition class. prn with me for now.      Recommended non-surgical and surgical options available:     - [facility] (MRP) for those with a BMI of >25 at [facility].  A comprehensive program that includes a Bariatrician, dieticians, exercise physiologists, and a therapist.  The program entails placement on a very low  calorie diet (VLCD) (<800 Cal/day) or low calorie diet (LCD) (<1000, <1200 Cal/day) along with a structured and graded exercise regimen and several informational sessions.   Typical weight loss is about 1-10 pounds/ week. AE may include fatigue (due to a depletion of glycogen), dizziness, headache, nausea, constipation, halitosis and muscle cramps which occur in the first few days of the diet.   Medical clearance required by PCP, along with CBC w/o diff, CMP, TSH, Lipid panel, Vitamin D, Hgba1c (if applicable) and EKG.    - Weight loss medications are used and discussed based on individual patient appropriateness taking into account medical concerns and other medications  - VLCD or LCD along with an structured physical activity regimen (exercise prescription) with frequent office visits  - Weight Loss Surgery (WLS)-  if BMI >35 with co-morbidities, or BMI >40, revision if applicable  All of the patients questions were answered, and they agree with the above plans.       Marin Comment, DO 11/27/2022 7:31 AM         [1]   Patient Active Problem List  Diagnosis    Chronic midline low back pain without sciatica    BMI 31.0-31.9,adult    Class 1 obesity due to excess calories without serious comorbidity with body mass index (BMI) of 31.0 to 31.9 in adult    Acute reaction to stress    Dysmenorrhea    Hyperopia    Incomplete spontaneous abortion without complication    Irregular menstrual cycle    Neck swelling    Ankle pain    Pain in joint, lower leg    Pain in left hip    Sprain and strain of knee and leg    Stress fracture of tibia    Pregnancy with inconclusive fetal viability, not applicable or unspecified    Pregnancy care for patient with recurrent pregnancy loss, unspecified trimester   [2]   Current Outpatient Medications   Medication Sig Dispense Refill    Prenatal Vit-Fe Fumarate-FA (PRENATAL VITAMINS PO) Take by mouth       No current facility-administered medications for this visit.

## 2022-11-27 NOTE — Patient Instructions (Signed)
It was so nice to meet you! Please feel free to send me a message on mychart if you have any questions or concerns before your next appointment.     If we ordered blood tests, please get your blood drawn for lab work. For most tests we ask that you fast (no food) for 8 hours before labs, but you can drink water and take medicines.     It is important for you to track your food intake. I would use an app like myfitnesspal, lose it, or baritastic.   Get at least 80-100 grams of protein per day  Aim for less than 100 grams of carbohydrates per day. Primarily you should be getting carbohydrates from non-starchy vegetables, 1-2 servings of fruit daily, beans, legumes, and whole grains. Eventually we can gradually decrease your carbohydrates depending on how you respond.   The rest of your calories from healthy fats such as fish (tuna, salmon, herring), avocado, eggs, seeds/nuts, dairy (yogurt without sugar added, cheese, sour cream), olives/olive oil, etc. You should eat these foods to help with satiety, but you don't have to add them in if you already feel satisfied.       General advice for losing fat and not muscle-   When you say that you would like to "lose weight," you really mean that you would like to lose fat. We call this adipose tissue.   However, when you lose weight you can also lose other types of tissue. Most people know that when you first start changing your diet you lose "water weight." This is normal because if you are eating less calories than you are burning, your body will use the carbohydrate that is stored in your muscle and liver for fuel. This is called glycogen, and glycogen is stored with water. So when the glycogen is used, the water is released.     We do not want you to lose muscle or bone when you are losing weight. There is only so much you can do to prevent this, so it is very important to pay attention to this and do what you can. The following will help reduce muscle and bone loss.      1. Have enough vitamin D to protect your bones. Many patients with excess weight have low vitamin D levels. Taking a supplement can help. Often we will check your level and recommend a supplement, so if you have questions about this, please ask.     2. Eat enough protein. The types of "calories" that you eat matter. Protein in your diet is used to repair your muscle tissue, and to make the building blocks for a lot of other things in your body like hormones and enzymes. It is also an important signal that tells your body that you are getting enough overall nutrition.   - If you do not eat enough protein in your diet, your body will use the protein it already has to do these things. This means that you can start to break down the muscle tissue to get those building blocks.   - When you lose muscle, not only are you weaker, but you are losing the tissue that is the most "active" and able to burn calories, so you are more likely to regain weight when you increase your food intake again. You also are less able to use the glucose (sugar/carbohydrates) that you each, which can cause other health issues.   - Most men should eat AT LEAST 100 grams   of protein per day, and most women should eat AT LEAST 80 grams of protein per day. But this can change depending on kidney disease and other medical conditions, so please ask your doctor or dietician about your individual protein goal.     3. Exercise. Activating your muscles with exercise is important to signal to your body that it needs your muscle tissue and also tells your bones to stay strong. Research is very clear that exercise helps to prevent losing muscle when you are losing weight.   - both cardio and strength types of exercise help, but strength training may help more. We recommend that you try to do a combination of both types to get the most protection. This also helps your overall health, mobility, and energy levels!  - If you are not sure what you are able to  do, please ask your health care team about what types of exercise are safe for you to do.

## 2023-02-03 ENCOUNTER — Encounter (INDEPENDENT_AMBULATORY_CARE_PROVIDER_SITE_OTHER): Payer: Self-pay

## 2023-02-21 ENCOUNTER — Encounter (INDEPENDENT_AMBULATORY_CARE_PROVIDER_SITE_OTHER): Payer: Self-pay

## 2023-03-26 ENCOUNTER — Ambulatory Visit
Admission: RE | Admit: 2023-03-26 | Discharge: 2023-03-26 | Disposition: A | Discharge status: Home / Self Care | Source: Ambulatory Visit | Attending: Specialist

## 2023-03-26 DIAGNOSIS — Z01411 Encounter for gynecological examination (general) (routine) with abnormal findings: Secondary | ICD-10-CM

## 2023-05-07 ENCOUNTER — Encounter: Payer: Self-pay | Admitting: Obstetrics & Gynecology

## 2023-05-22 ENCOUNTER — Other Ambulatory Visit: Payer: Self-pay | Admitting: Obstetrics & Gynecology

## 2023-05-22 DIAGNOSIS — O09512 Supervision of elderly primigravida, second trimester: Secondary | ICD-10-CM

## 2023-07-14 ENCOUNTER — Ambulatory Visit
Admission: RE | Admit: 2023-07-14 | Discharge: 2023-07-14 | Disposition: A | Discharge status: Home / Self Care | Source: Ambulatory Visit | Attending: Obstetrics & Gynecology | Admitting: Obstetrics & Gynecology

## 2023-07-14 ENCOUNTER — Ambulatory Visit (HOSPITAL_BASED_OUTPATIENT_CLINIC_OR_DEPARTMENT_OTHER)
Admission: RE | Admit: 2023-07-14 | Discharge: 2023-07-14 | Disposition: A | Discharge status: Home / Self Care | Source: Ambulatory Visit | Attending: Obstetrics & Gynecology | Admitting: Obstetrics & Gynecology

## 2023-07-14 ENCOUNTER — Other Ambulatory Visit: Payer: Self-pay | Admitting: Obstetrics & Gynecology

## 2023-07-14 DIAGNOSIS — O09522 Supervision of elderly multigravida, second trimester: Secondary | ICD-10-CM | POA: Insufficient documentation

## 2023-07-14 DIAGNOSIS — O09512 Supervision of elderly primigravida, second trimester: Secondary | ICD-10-CM | POA: Insufficient documentation

## 2023-07-14 DIAGNOSIS — O99210 Obesity complicating pregnancy, unspecified trimester: Secondary | ICD-10-CM | POA: Insufficient documentation

## 2023-07-14 DIAGNOSIS — O09812 Supervision of pregnancy resulting from assisted reproductive technology, second trimester: Secondary | ICD-10-CM

## 2023-07-14 NOTE — Consults (Signed)
 MFM Consultation Note    Cheyenne Rangel  [id number]  Referring Provider:  Melonie Delon SAUNDERS, MD  [address]  [place],  [place] [address]       07/14/2023    Dear Dr. Melonie:    I had the pleasure of seeing Cheyenne Rangel for a maternal-fetal medicine consultation today.  Thank you very much for the referral.    As you know, Cheyenne Rangel is a 43 y.o. [id number] at [redacted]w[redacted]d by embryo transfer date.  She is being seen in consultation today for AMA, IVF, BMI>30.  Her husband accompanied her to today's visit.    I have reviewed and updated her medical, surgical, social, and family histories today.  I have also reviewed and updated her current medications and allergies.  The details are as follows:    Past Medical History:   Diagnosis Date    Eczema     childhood    Left upper arm pain 12/08/2020     Past Surgical History[1]  Social History[2]  Family History[3]  OB History       Gravida   8    Para   1    Term   1    Preterm        AB   6    Living   1         SAB   6    IAB   0    Ectopic        Multiple        Live Births   1               Current Medications[4]  Allergies[5]     ROS  See HPI above. A comprehensive ROS was otherwise negative.       Physical Exam  LMP  (Approximate)   A physical exam was not completed at today's visit.      Pertinent labs:  Low risk NIPT, external results reviewed    Today's ultrasound findings:  See report    Assessment/Plan:  Cheyenne Rangel is a 43 y.o. [phone] at [redacted]w[redacted]d by embryo transfer date.  This pregnancy is complicated by AMA, IVF, BMI>30.    Advanced maternal age  Advanced maternal age is defined as age >35 years at EDD. Increased risks of hypertensive disorders, poor fetal growth, and stillbirth have been associated with advancing age. The risks increase with increasing age. There is an age related increase in genetic abnormalities, specifically aneuploidy. Due to these increased risks, there are recommendations for continued fetal growth  surveillance into the third trimester. Antenatal testing is dependent on maternal age at the time of EDD. At this time, there are not strict recommendations on timing of delivery but generally a delivery by EDD is supported.     In-vitro fertilization  The use of IVF, with and without ICSI, has been associated with an increased risk for preeclampsia, congenital anomalies (specifically cardiac), hypertensive disorders, placental abruption, placenta previa and other placental abnormalities, and small for gestational age and cesarean delivery. Some studies have suggested an increased risk for certain birth defects (cardiac and GU), particularly with ICSI.    Obesity  Obesity during pregnancy is associated with hypertension, preeclampsia, diabetes, venous thromboembolism, fetal macrosomia, cesarean delivery, and complications of cesarean delivery (excessive blood loss, infection, wound separation). Obesity is associated with fetal anomalies, specifically neural tube defects. Multiple studies suggest that pre-pregnancy obesity (BMI >= 30) increases the risk for  stillbirth, though the etiology is not known.    Recommendations:  - Follow-up anatomy in 2 weeks  - Follow-up growth ultrasound at 30 and 36 weeks  - Start weekly antenatal testing at 36 weeks      Thank you for the opportunity to participate in the care of Cheyenne Rangel. Please do not hesitate to contact me with any questions.    Respectfully,  Daleen Holm, MD, [facility]       [1]   Past Surgical History:  Procedure Laterality Date    APPENDECTOMY (OPEN)      FOOT SURGERY Right 08/2019    neuroma   [2]   Social History  Socioeconomic History    Marital status: Married   Tobacco Use    Smoking status: Never     Passive exposure: Never    Smokeless tobacco: Never   Vaping Use    Vaping status: Never Used   Substance and Sexual Activity    Alcohol use: Not Currently     Comment: occasionally    Drug use: Never    Sexual  activity: Yes     Partners: Male     Birth control/protection: None     Social Drivers of Health     Financial Resource Strain: Low Risk  (11/11/2022)    Overall Financial Resource Strain (CARDIA)     Difficulty of Paying Living Expenses: Not hard at all   Food Insecurity: No Food Insecurity (11/11/2022)    Hunger Vital Sign     Worried About Running Out of Food in the Last Year: Never true     Ran Out of Food in the Last Year: Never true   Transportation Needs: No Transportation Needs (11/11/2022)    PRAPARE - Therapist, Art (Medical): No     Lack of Transportation (Non-Medical): No   Physical Activity: Sufficiently Active (11/11/2022)    Exercise Vital Sign     Days of Exercise per Week: 3 days     Minutes of Exercise per Session: 50 min   Recent Concern: Physical Activity - Insufficiently Active (08/23/2022)    Exercise Vital Sign     Days of Exercise per Week: 3 days     Minutes of Exercise per Session: 40 min   Stress: No Stress Concern Present (11/11/2022)    [employer] of Occupational Health - Occupational Stress Questionnaire     Feeling of Stress : Not at all   Social Connections: Moderately Isolated (11/11/2022)    Social Connection and Isolation Panel [NHANES]     Frequency of Communication with Friends and Family: Twice a week     Frequency of Social Gatherings with Friends and Family: Never     Attends Religious Services: Never     Database Administrator or Organizations: Yes     Attends Engineer, Structural: More than 4 times per year     Marital Status: Married   Catering Manager Violence: Not At Risk (11/11/2022)    Humiliation, Afraid, Rape, and Kick questionnaire     Fear of Current or Ex-Partner: No     Emotionally Abused: No     Physically Abused: No     Sexually Abused: No   Housing Stability: Low Risk  (11/11/2022)    Housing Stability Vital Sign     Unable to Pay for Housing in the Last Year: No     Number of Times Moved in the  Last Year: 0     Homeless in the  Last Year: No   [3]   Family History  Problem Relation Name Age of Onset    Heart attack Father  39    Rheum arthritis Brother      No known problems Son      Breast cancer Maternal Grandmother      Cancer Maternal Grandmother          breast    Heart attack Maternal Grandfather      Stroke Maternal Grandfather     [4]   Current Outpatient Medications:     aspirin EC 81 MG EC tablet, Take 1 tablet (81 mg) by mouth daily, Disp: , Rfl:     Prenatal Vit-Fe Fumarate-FA (PRENATAL VITAMINS PO), Take by mouth, Disp: , Rfl:   [5] No Known Allergies

## 2023-07-28 ENCOUNTER — Ambulatory Visit

## 2023-08-08 ENCOUNTER — Ambulatory Visit
Admission: RE | Admit: 2023-08-08 | Discharge: 2023-08-08 | Disposition: A | Discharge status: Home / Self Care | Source: Ambulatory Visit | Attending: Obstetrics & Gynecology | Admitting: Obstetrics & Gynecology

## 2023-08-08 VITALS — BP 120/80 | HR 83

## 2023-08-08 DIAGNOSIS — O09512 Supervision of elderly primigravida, second trimester: Secondary | ICD-10-CM | POA: Insufficient documentation

## 2023-08-08 DIAGNOSIS — O09529 Supervision of elderly multigravida, unspecified trimester: Secondary | ICD-10-CM | POA: Insufficient documentation

## 2023-08-08 DIAGNOSIS — O09819 Supervision of pregnancy resulting from assisted reproductive technology, unspecified trimester: Secondary | ICD-10-CM | POA: Insufficient documentation

## 2023-09-12 ENCOUNTER — Ambulatory Visit
Admission: RE | Admit: 2023-09-12 | Discharge: 2023-09-12 | Disposition: A | Discharge status: Home / Self Care | Source: Ambulatory Visit | Attending: Obstetrics & Gynecology | Admitting: Obstetrics & Gynecology

## 2023-09-12 DIAGNOSIS — O09529 Supervision of elderly multigravida, unspecified trimester: Secondary | ICD-10-CM | POA: Insufficient documentation

## 2023-09-12 DIAGNOSIS — O09819 Supervision of pregnancy resulting from assisted reproductive technology, unspecified trimester: Secondary | ICD-10-CM | POA: Insufficient documentation

## 2024-01-15 ENCOUNTER — Other Ambulatory Visit (INDEPENDENT_AMBULATORY_CARE_PROVIDER_SITE_OTHER): Payer: Self-pay | Admitting: Family

## 2024-02-24 ENCOUNTER — Encounter (HOSPITAL_BASED_OUTPATIENT_CLINIC_OR_DEPARTMENT_OTHER): Payer: Self-pay
